# Patient Record
Sex: Male | Born: 1945 | Race: White | Hispanic: No | Marital: Single | Smoking: Never smoker
Health system: Southern US, Community
[De-identification: ages and names within clinical notes are randomized; demographics above are authoritative.]

## PROBLEM LIST (undated history)

## (undated) DIAGNOSIS — E079 Disorder of thyroid, unspecified: Secondary | ICD-10-CM

## (undated) DIAGNOSIS — F0390 Unspecified dementia without behavioral disturbance: Secondary | ICD-10-CM

---

## 2007-12-14 ENCOUNTER — Ambulatory Visit: Payer: Self-pay | Admitting: Orthopedic Surgery

## 2008-01-01 ENCOUNTER — Ambulatory Visit: Payer: Self-pay | Admitting: Orthopedic Surgery

## 2011-03-29 ENCOUNTER — Ambulatory Visit: Payer: Self-pay | Admitting: Internal Medicine

## 2012-03-26 DIAGNOSIS — M8448XA Pathological fracture, other site, initial encounter for fracture: Secondary | ICD-10-CM | POA: Diagnosis not present

## 2012-03-26 DIAGNOSIS — Z125 Encounter for screening for malignant neoplasm of prostate: Secondary | ICD-10-CM | POA: Diagnosis not present

## 2012-03-26 DIAGNOSIS — I359 Nonrheumatic aortic valve disorder, unspecified: Secondary | ICD-10-CM | POA: Diagnosis not present

## 2012-03-26 DIAGNOSIS — Z79899 Other long term (current) drug therapy: Secondary | ICD-10-CM | POA: Diagnosis not present

## 2012-05-16 DIAGNOSIS — H905 Unspecified sensorineural hearing loss: Secondary | ICD-10-CM | POA: Diagnosis not present

## 2012-05-16 DIAGNOSIS — H9319 Tinnitus, unspecified ear: Secondary | ICD-10-CM | POA: Diagnosis not present

## 2012-05-16 DIAGNOSIS — J018 Other acute sinusitis: Secondary | ICD-10-CM | POA: Diagnosis not present

## 2012-05-16 DIAGNOSIS — R220 Localized swelling, mass and lump, head: Secondary | ICD-10-CM | POA: Diagnosis not present

## 2012-05-16 DIAGNOSIS — R221 Localized swelling, mass and lump, neck: Secondary | ICD-10-CM | POA: Diagnosis not present

## 2012-05-16 DIAGNOSIS — H903 Sensorineural hearing loss, bilateral: Secondary | ICD-10-CM | POA: Diagnosis not present

## 2012-05-18 ENCOUNTER — Ambulatory Visit: Payer: Self-pay | Admitting: Otolaryngology

## 2012-05-18 DIAGNOSIS — M5030 Other cervical disc degeneration, unspecified cervical region: Secondary | ICD-10-CM | POA: Diagnosis not present

## 2012-05-18 DIAGNOSIS — R220 Localized swelling, mass and lump, head: Secondary | ICD-10-CM | POA: Diagnosis not present

## 2012-05-22 DIAGNOSIS — R221 Localized swelling, mass and lump, neck: Secondary | ICD-10-CM | POA: Diagnosis not present

## 2012-05-22 DIAGNOSIS — J018 Other acute sinusitis: Secondary | ICD-10-CM | POA: Diagnosis not present

## 2012-05-22 DIAGNOSIS — H903 Sensorineural hearing loss, bilateral: Secondary | ICD-10-CM | POA: Diagnosis not present

## 2012-05-22 DIAGNOSIS — H9319 Tinnitus, unspecified ear: Secondary | ICD-10-CM | POA: Diagnosis not present

## 2012-10-22 DIAGNOSIS — S7000XA Contusion of unspecified hip, initial encounter: Secondary | ICD-10-CM | POA: Diagnosis not present

## 2012-11-22 DIAGNOSIS — H9319 Tinnitus, unspecified ear: Secondary | ICD-10-CM | POA: Diagnosis not present

## 2012-11-22 DIAGNOSIS — J018 Other acute sinusitis: Secondary | ICD-10-CM | POA: Diagnosis not present

## 2012-12-18 DIAGNOSIS — M25559 Pain in unspecified hip: Secondary | ICD-10-CM | POA: Diagnosis not present

## 2013-01-15 DIAGNOSIS — C61 Malignant neoplasm of prostate: Secondary | ICD-10-CM | POA: Diagnosis not present

## 2013-01-15 DIAGNOSIS — D400 Neoplasm of uncertain behavior of prostate: Secondary | ICD-10-CM | POA: Diagnosis not present

## 2013-01-15 DIAGNOSIS — N529 Male erectile dysfunction, unspecified: Secondary | ICD-10-CM | POA: Diagnosis not present

## 2013-02-04 DIAGNOSIS — M25559 Pain in unspecified hip: Secondary | ICD-10-CM | POA: Diagnosis not present

## 2013-03-21 DIAGNOSIS — I359 Nonrheumatic aortic valve disorder, unspecified: Secondary | ICD-10-CM | POA: Diagnosis not present

## 2013-03-21 DIAGNOSIS — Z0000 Encounter for general adult medical examination without abnormal findings: Secondary | ICD-10-CM | POA: Diagnosis not present

## 2013-03-28 DIAGNOSIS — Z0000 Encounter for general adult medical examination without abnormal findings: Secondary | ICD-10-CM | POA: Diagnosis not present

## 2013-03-28 DIAGNOSIS — I359 Nonrheumatic aortic valve disorder, unspecified: Secondary | ICD-10-CM | POA: Diagnosis not present

## 2013-04-30 DIAGNOSIS — L570 Actinic keratosis: Secondary | ICD-10-CM | POA: Diagnosis not present

## 2013-04-30 DIAGNOSIS — L821 Other seborrheic keratosis: Secondary | ICD-10-CM | POA: Diagnosis not present

## 2013-05-09 DIAGNOSIS — Z86010 Personal history of colonic polyps: Secondary | ICD-10-CM | POA: Diagnosis not present

## 2013-07-16 DIAGNOSIS — N529 Male erectile dysfunction, unspecified: Secondary | ICD-10-CM | POA: Diagnosis not present

## 2013-07-16 DIAGNOSIS — C61 Malignant neoplasm of prostate: Secondary | ICD-10-CM | POA: Diagnosis not present

## 2013-07-16 DIAGNOSIS — D400 Neoplasm of uncertain behavior of prostate: Secondary | ICD-10-CM | POA: Diagnosis not present

## 2013-07-31 DIAGNOSIS — Z4789 Encounter for other orthopedic aftercare: Secondary | ICD-10-CM | POA: Diagnosis not present

## 2013-08-03 DIAGNOSIS — S42009A Fracture of unspecified part of unspecified clavicle, initial encounter for closed fracture: Secondary | ICD-10-CM | POA: Diagnosis not present

## 2013-08-05 DIAGNOSIS — M25519 Pain in unspecified shoulder: Secondary | ICD-10-CM | POA: Diagnosis not present

## 2013-08-05 DIAGNOSIS — R079 Chest pain, unspecified: Secondary | ICD-10-CM | POA: Diagnosis not present

## 2013-08-09 DIAGNOSIS — E039 Hypothyroidism, unspecified: Secondary | ICD-10-CM | POA: Diagnosis not present

## 2013-08-26 DIAGNOSIS — M25519 Pain in unspecified shoulder: Secondary | ICD-10-CM | POA: Diagnosis not present

## 2013-08-28 DIAGNOSIS — L570 Actinic keratosis: Secondary | ICD-10-CM | POA: Diagnosis not present

## 2013-08-28 DIAGNOSIS — L821 Other seborrheic keratosis: Secondary | ICD-10-CM | POA: Diagnosis not present

## 2013-09-25 DIAGNOSIS — S42023A Displaced fracture of shaft of unspecified clavicle, initial encounter for closed fracture: Secondary | ICD-10-CM | POA: Diagnosis not present

## 2014-01-27 DIAGNOSIS — Z4789 Encounter for other orthopedic aftercare: Secondary | ICD-10-CM | POA: Diagnosis not present

## 2014-01-27 DIAGNOSIS — M25552 Pain in left hip: Secondary | ICD-10-CM | POA: Diagnosis not present

## 2014-03-11 DIAGNOSIS — H2513 Age-related nuclear cataract, bilateral: Secondary | ICD-10-CM | POA: Diagnosis not present

## 2014-04-02 DIAGNOSIS — E039 Hypothyroidism, unspecified: Secondary | ICD-10-CM | POA: Diagnosis not present

## 2014-04-09 DIAGNOSIS — E039 Hypothyroidism, unspecified: Secondary | ICD-10-CM | POA: Diagnosis not present

## 2014-04-09 DIAGNOSIS — Z0000 Encounter for general adult medical examination without abnormal findings: Secondary | ICD-10-CM | POA: Diagnosis not present

## 2014-05-16 ENCOUNTER — Ambulatory Visit: Payer: Self-pay | Admitting: Gastroenterology

## 2014-05-16 DIAGNOSIS — Z86010 Personal history of colonic polyps: Secondary | ICD-10-CM | POA: Diagnosis not present

## 2014-07-15 DIAGNOSIS — D400 Neoplasm of uncertain behavior of prostate: Secondary | ICD-10-CM | POA: Diagnosis not present

## 2014-07-15 DIAGNOSIS — N5201 Erectile dysfunction due to arterial insufficiency: Secondary | ICD-10-CM | POA: Diagnosis not present

## 2014-07-28 DIAGNOSIS — M720 Palmar fascial fibromatosis [Dupuytren]: Secondary | ICD-10-CM | POA: Diagnosis not present

## 2014-07-28 DIAGNOSIS — L821 Other seborrheic keratosis: Secondary | ICD-10-CM | POA: Diagnosis not present

## 2015-02-02 DIAGNOSIS — M545 Low back pain: Secondary | ICD-10-CM | POA: Diagnosis not present

## 2015-02-10 DIAGNOSIS — M545 Low back pain: Secondary | ICD-10-CM | POA: Diagnosis not present

## 2015-02-12 DIAGNOSIS — M545 Low back pain: Secondary | ICD-10-CM | POA: Diagnosis not present

## 2015-02-17 DIAGNOSIS — M545 Low back pain: Secondary | ICD-10-CM | POA: Diagnosis not present

## 2015-02-19 DIAGNOSIS — M545 Low back pain: Secondary | ICD-10-CM | POA: Diagnosis not present

## 2015-02-24 DIAGNOSIS — M545 Low back pain: Secondary | ICD-10-CM | POA: Diagnosis not present

## 2015-02-26 DIAGNOSIS — M545 Low back pain: Secondary | ICD-10-CM | POA: Diagnosis not present

## 2015-03-10 DIAGNOSIS — M545 Low back pain: Secondary | ICD-10-CM | POA: Diagnosis not present

## 2015-03-12 DIAGNOSIS — M545 Low back pain: Secondary | ICD-10-CM | POA: Diagnosis not present

## 2015-03-18 DIAGNOSIS — M545 Low back pain: Secondary | ICD-10-CM | POA: Diagnosis not present

## 2015-04-06 DIAGNOSIS — Z125 Encounter for screening for malignant neoplasm of prostate: Secondary | ICD-10-CM | POA: Diagnosis not present

## 2015-04-06 DIAGNOSIS — E039 Hypothyroidism, unspecified: Secondary | ICD-10-CM | POA: Diagnosis not present

## 2015-04-06 DIAGNOSIS — Z0000 Encounter for general adult medical examination without abnormal findings: Secondary | ICD-10-CM | POA: Diagnosis not present

## 2015-04-13 DIAGNOSIS — E039 Hypothyroidism, unspecified: Secondary | ICD-10-CM | POA: Diagnosis not present

## 2015-04-13 DIAGNOSIS — Z79899 Other long term (current) drug therapy: Secondary | ICD-10-CM | POA: Diagnosis not present

## 2015-04-13 DIAGNOSIS — Q231 Congenital insufficiency of aortic valve: Secondary | ICD-10-CM | POA: Diagnosis not present

## 2015-04-13 DIAGNOSIS — I350 Nonrheumatic aortic (valve) stenosis: Secondary | ICD-10-CM | POA: Diagnosis not present

## 2015-04-13 DIAGNOSIS — Z125 Encounter for screening for malignant neoplasm of prostate: Secondary | ICD-10-CM | POA: Diagnosis not present

## 2015-04-21 DIAGNOSIS — I350 Nonrheumatic aortic (valve) stenosis: Secondary | ICD-10-CM | POA: Diagnosis not present

## 2015-04-21 DIAGNOSIS — Q231 Congenital insufficiency of aortic valve: Secondary | ICD-10-CM | POA: Diagnosis not present

## 2015-06-22 DIAGNOSIS — H353131 Nonexudative age-related macular degeneration, bilateral, early dry stage: Secondary | ICD-10-CM | POA: Diagnosis not present

## 2016-04-04 DIAGNOSIS — L821 Other seborrheic keratosis: Secondary | ICD-10-CM | POA: Diagnosis not present

## 2016-04-04 DIAGNOSIS — C4490 Unspecified malignant neoplasm of skin, unspecified: Secondary | ICD-10-CM | POA: Diagnosis not present

## 2016-04-04 DIAGNOSIS — Z859 Personal history of malignant neoplasm, unspecified: Secondary | ICD-10-CM | POA: Diagnosis not present

## 2016-04-07 DIAGNOSIS — E039 Hypothyroidism, unspecified: Secondary | ICD-10-CM | POA: Diagnosis not present

## 2016-04-07 DIAGNOSIS — Z79899 Other long term (current) drug therapy: Secondary | ICD-10-CM | POA: Diagnosis not present

## 2016-04-07 DIAGNOSIS — Z125 Encounter for screening for malignant neoplasm of prostate: Secondary | ICD-10-CM | POA: Diagnosis not present

## 2016-04-13 DIAGNOSIS — Z0000 Encounter for general adult medical examination without abnormal findings: Secondary | ICD-10-CM | POA: Diagnosis not present

## 2016-04-13 DIAGNOSIS — Q230 Congenital stenosis of aortic valve: Secondary | ICD-10-CM | POA: Diagnosis not present

## 2016-04-13 DIAGNOSIS — E039 Hypothyroidism, unspecified: Secondary | ICD-10-CM | POA: Diagnosis not present

## 2016-04-13 DIAGNOSIS — Q231 Congenital insufficiency of aortic valve: Secondary | ICD-10-CM | POA: Diagnosis not present

## 2016-04-27 DIAGNOSIS — Q230 Congenital stenosis of aortic valve: Secondary | ICD-10-CM | POA: Diagnosis not present

## 2016-04-27 DIAGNOSIS — Q231 Congenital insufficiency of aortic valve: Secondary | ICD-10-CM | POA: Diagnosis not present

## 2016-04-28 DIAGNOSIS — M79641 Pain in right hand: Secondary | ICD-10-CM | POA: Diagnosis not present

## 2016-07-13 DIAGNOSIS — Z23 Encounter for immunization: Secondary | ICD-10-CM | POA: Diagnosis not present

## 2016-07-13 DIAGNOSIS — E039 Hypothyroidism, unspecified: Secondary | ICD-10-CM | POA: Diagnosis not present

## 2016-07-13 DIAGNOSIS — S41112A Laceration without foreign body of left upper arm, initial encounter: Secondary | ICD-10-CM | POA: Diagnosis not present

## 2016-10-20 DIAGNOSIS — N5201 Erectile dysfunction due to arterial insufficiency: Secondary | ICD-10-CM | POA: Diagnosis not present

## 2016-10-20 DIAGNOSIS — C61 Malignant neoplasm of prostate: Secondary | ICD-10-CM | POA: Diagnosis not present

## 2016-10-20 DIAGNOSIS — D400 Neoplasm of uncertain behavior of prostate: Secondary | ICD-10-CM | POA: Diagnosis not present

## 2017-01-04 DIAGNOSIS — H5213 Myopia, bilateral: Secondary | ICD-10-CM | POA: Diagnosis not present

## 2017-01-04 DIAGNOSIS — H2513 Age-related nuclear cataract, bilateral: Secondary | ICD-10-CM | POA: Diagnosis not present

## 2017-01-05 DIAGNOSIS — M65342 Trigger finger, left ring finger: Secondary | ICD-10-CM | POA: Diagnosis not present

## 2017-01-05 DIAGNOSIS — M65341 Trigger finger, right ring finger: Secondary | ICD-10-CM | POA: Diagnosis not present

## 2017-02-16 DIAGNOSIS — L821 Other seborrheic keratosis: Secondary | ICD-10-CM | POA: Diagnosis not present

## 2017-03-24 DIAGNOSIS — R05 Cough: Secondary | ICD-10-CM | POA: Diagnosis not present

## 2017-03-24 DIAGNOSIS — J209 Acute bronchitis, unspecified: Secondary | ICD-10-CM | POA: Diagnosis not present

## 2017-04-12 DIAGNOSIS — Z0000 Encounter for general adult medical examination without abnormal findings: Secondary | ICD-10-CM | POA: Diagnosis not present

## 2017-04-12 DIAGNOSIS — Z125 Encounter for screening for malignant neoplasm of prostate: Secondary | ICD-10-CM | POA: Diagnosis not present

## 2017-04-12 DIAGNOSIS — E039 Hypothyroidism, unspecified: Secondary | ICD-10-CM | POA: Diagnosis not present

## 2017-04-19 DIAGNOSIS — M6530 Trigger finger, unspecified finger: Secondary | ICD-10-CM | POA: Diagnosis not present

## 2017-04-19 DIAGNOSIS — Z0000 Encounter for general adult medical examination without abnormal findings: Secondary | ICD-10-CM | POA: Diagnosis not present

## 2017-04-19 DIAGNOSIS — E039 Hypothyroidism, unspecified: Secondary | ICD-10-CM | POA: Diagnosis not present

## 2017-04-19 DIAGNOSIS — Z125 Encounter for screening for malignant neoplasm of prostate: Secondary | ICD-10-CM | POA: Diagnosis not present

## 2017-04-19 DIAGNOSIS — Z79899 Other long term (current) drug therapy: Secondary | ICD-10-CM | POA: Diagnosis not present

## 2017-05-01 DIAGNOSIS — M65342 Trigger finger, left ring finger: Secondary | ICD-10-CM | POA: Diagnosis not present

## 2017-05-01 DIAGNOSIS — M65341 Trigger finger, right ring finger: Secondary | ICD-10-CM | POA: Diagnosis not present

## 2017-05-12 ENCOUNTER — Other Ambulatory Visit: Payer: Self-pay | Admitting: Internal Medicine

## 2017-05-12 DIAGNOSIS — G9349 Other encephalopathy: Secondary | ICD-10-CM

## 2017-05-17 ENCOUNTER — Ambulatory Visit
Admission: RE | Admit: 2017-05-17 | Discharge: 2017-05-17 | Disposition: A | Discharge status: Home / Self Care | Payer: PPO | Source: Ambulatory Visit | Attending: Internal Medicine | Admitting: Internal Medicine

## 2017-05-17 DIAGNOSIS — I672 Cerebral atherosclerosis: Secondary | ICD-10-CM | POA: Diagnosis not present

## 2017-05-17 DIAGNOSIS — G319 Degenerative disease of nervous system, unspecified: Secondary | ICD-10-CM | POA: Diagnosis not present

## 2017-05-17 DIAGNOSIS — G9349 Other encephalopathy: Secondary | ICD-10-CM | POA: Diagnosis not present

## 2017-05-17 DIAGNOSIS — I739 Peripheral vascular disease, unspecified: Secondary | ICD-10-CM | POA: Insufficient documentation

## 2017-05-17 DIAGNOSIS — R413 Other amnesia: Secondary | ICD-10-CM | POA: Diagnosis not present

## 2017-05-29 DIAGNOSIS — I679 Cerebrovascular disease, unspecified: Secondary | ICD-10-CM | POA: Diagnosis not present

## 2017-05-29 DIAGNOSIS — Q231 Congenital insufficiency of aortic valve: Secondary | ICD-10-CM | POA: Diagnosis not present

## 2017-05-29 DIAGNOSIS — Q230 Congenital stenosis of aortic valve: Secondary | ICD-10-CM | POA: Diagnosis not present

## 2017-05-31 DIAGNOSIS — G3184 Mild cognitive impairment, so stated: Secondary | ICD-10-CM | POA: Diagnosis not present

## 2017-05-31 DIAGNOSIS — R413 Other amnesia: Secondary | ICD-10-CM | POA: Diagnosis not present

## 2017-06-08 DIAGNOSIS — I679 Cerebrovascular disease, unspecified: Secondary | ICD-10-CM | POA: Diagnosis not present

## 2017-06-08 DIAGNOSIS — I6523 Occlusion and stenosis of bilateral carotid arteries: Secondary | ICD-10-CM | POA: Diagnosis not present

## 2017-06-08 DIAGNOSIS — Q230 Congenital stenosis of aortic valve: Secondary | ICD-10-CM | POA: Diagnosis not present

## 2017-06-08 DIAGNOSIS — Q231 Congenital insufficiency of aortic valve: Secondary | ICD-10-CM | POA: Diagnosis not present

## 2017-06-23 DIAGNOSIS — G3184 Mild cognitive impairment, so stated: Secondary | ICD-10-CM | POA: Diagnosis not present

## 2017-07-11 DIAGNOSIS — L821 Other seborrheic keratosis: Secondary | ICD-10-CM | POA: Diagnosis not present

## 2017-07-11 DIAGNOSIS — L570 Actinic keratosis: Secondary | ICD-10-CM | POA: Diagnosis not present

## 2017-08-03 DIAGNOSIS — G3184 Mild cognitive impairment, so stated: Secondary | ICD-10-CM | POA: Diagnosis not present

## 2017-09-26 DIAGNOSIS — L570 Actinic keratosis: Secondary | ICD-10-CM | POA: Diagnosis not present

## 2017-10-25 DIAGNOSIS — C61 Malignant neoplasm of prostate: Secondary | ICD-10-CM | POA: Diagnosis not present

## 2017-10-25 DIAGNOSIS — Z125 Encounter for screening for malignant neoplasm of prostate: Secondary | ICD-10-CM | POA: Diagnosis not present

## 2017-10-25 DIAGNOSIS — D400 Neoplasm of uncertain behavior of prostate: Secondary | ICD-10-CM | POA: Diagnosis not present

## 2017-10-25 DIAGNOSIS — N5201 Erectile dysfunction due to arterial insufficiency: Secondary | ICD-10-CM | POA: Diagnosis not present

## 2017-10-31 DIAGNOSIS — M25552 Pain in left hip: Secondary | ICD-10-CM | POA: Diagnosis not present

## 2017-12-29 DIAGNOSIS — H6502 Acute serous otitis media, left ear: Secondary | ICD-10-CM | POA: Diagnosis not present

## 2017-12-29 DIAGNOSIS — J3489 Other specified disorders of nose and nasal sinuses: Secondary | ICD-10-CM | POA: Diagnosis not present

## 2017-12-29 DIAGNOSIS — Z23 Encounter for immunization: Secondary | ICD-10-CM | POA: Diagnosis not present

## 2018-01-24 DIAGNOSIS — R05 Cough: Secondary | ICD-10-CM | POA: Diagnosis not present

## 2018-01-24 DIAGNOSIS — J019 Acute sinusitis, unspecified: Secondary | ICD-10-CM | POA: Diagnosis not present

## 2018-02-05 DIAGNOSIS — R5382 Chronic fatigue, unspecified: Secondary | ICD-10-CM | POA: Diagnosis not present

## 2018-02-05 DIAGNOSIS — G3184 Mild cognitive impairment, so stated: Secondary | ICD-10-CM | POA: Diagnosis not present

## 2018-02-23 DIAGNOSIS — R05 Cough: Secondary | ICD-10-CM | POA: Diagnosis not present

## 2018-02-23 DIAGNOSIS — J019 Acute sinusitis, unspecified: Secondary | ICD-10-CM | POA: Diagnosis not present

## 2018-03-08 DIAGNOSIS — R05 Cough: Secondary | ICD-10-CM | POA: Diagnosis not present

## 2018-03-08 DIAGNOSIS — R0982 Postnasal drip: Secondary | ICD-10-CM | POA: Diagnosis not present

## 2018-03-08 DIAGNOSIS — R0602 Shortness of breath: Secondary | ICD-10-CM | POA: Diagnosis not present

## 2018-03-08 DIAGNOSIS — J019 Acute sinusitis, unspecified: Secondary | ICD-10-CM | POA: Diagnosis not present

## 2018-03-22 DIAGNOSIS — H2513 Age-related nuclear cataract, bilateral: Secondary | ICD-10-CM | POA: Diagnosis not present

## 2018-03-22 DIAGNOSIS — H35363 Drusen (degenerative) of macula, bilateral: Secondary | ICD-10-CM | POA: Diagnosis not present

## 2018-03-22 DIAGNOSIS — H43813 Vitreous degeneration, bilateral: Secondary | ICD-10-CM | POA: Diagnosis not present

## 2018-04-11 DIAGNOSIS — L570 Actinic keratosis: Secondary | ICD-10-CM | POA: Diagnosis not present

## 2018-04-11 DIAGNOSIS — L821 Other seborrheic keratosis: Secondary | ICD-10-CM | POA: Diagnosis not present

## 2018-04-18 DIAGNOSIS — Z125 Encounter for screening for malignant neoplasm of prostate: Secondary | ICD-10-CM | POA: Diagnosis not present

## 2018-04-18 DIAGNOSIS — E039 Hypothyroidism, unspecified: Secondary | ICD-10-CM | POA: Diagnosis not present

## 2018-04-18 DIAGNOSIS — Z79899 Other long term (current) drug therapy: Secondary | ICD-10-CM | POA: Diagnosis not present

## 2018-04-25 DIAGNOSIS — Z125 Encounter for screening for malignant neoplasm of prostate: Secondary | ICD-10-CM | POA: Diagnosis not present

## 2018-04-25 DIAGNOSIS — C61 Malignant neoplasm of prostate: Secondary | ICD-10-CM | POA: Diagnosis not present

## 2018-04-25 DIAGNOSIS — Q231 Congenital insufficiency of aortic valve: Secondary | ICD-10-CM | POA: Diagnosis not present

## 2018-04-25 DIAGNOSIS — Z0000 Encounter for general adult medical examination without abnormal findings: Secondary | ICD-10-CM | POA: Diagnosis not present

## 2018-04-25 DIAGNOSIS — E039 Hypothyroidism, unspecified: Secondary | ICD-10-CM | POA: Diagnosis not present

## 2018-04-25 DIAGNOSIS — Q230 Congenital stenosis of aortic valve: Secondary | ICD-10-CM | POA: Diagnosis not present

## 2018-11-11 ENCOUNTER — Inpatient Hospital Stay: Payer: PPO

## 2018-11-11 ENCOUNTER — Emergency Department: Payer: PPO

## 2018-11-11 ENCOUNTER — Inpatient Hospital Stay
Admission: EM | Admit: 2018-11-11 | Discharge: 2018-11-14 | DRG: 065 | Disposition: A | Discharge status: Home Health | Payer: PPO | Attending: Internal Medicine | Admitting: Internal Medicine

## 2018-11-11 ENCOUNTER — Other Ambulatory Visit: Payer: Self-pay

## 2018-11-11 DIAGNOSIS — E785 Hyperlipidemia, unspecified: Secondary | ICD-10-CM | POA: Diagnosis present

## 2018-11-11 DIAGNOSIS — E039 Hypothyroidism, unspecified: Secondary | ICD-10-CM | POA: Diagnosis present

## 2018-11-11 DIAGNOSIS — H532 Diplopia: Secondary | ICD-10-CM | POA: Diagnosis not present

## 2018-11-11 DIAGNOSIS — R29810 Facial weakness: Secondary | ICD-10-CM | POA: Diagnosis present

## 2018-11-11 DIAGNOSIS — R29705 NIHSS score 5: Secondary | ICD-10-CM | POA: Diagnosis present

## 2018-11-11 DIAGNOSIS — K219 Gastro-esophageal reflux disease without esophagitis: Secondary | ICD-10-CM | POA: Diagnosis present

## 2018-11-11 DIAGNOSIS — R29702 NIHSS score 2: Secondary | ICD-10-CM | POA: Diagnosis present

## 2018-11-11 DIAGNOSIS — I712 Thoracic aortic aneurysm, without rupture: Secondary | ICD-10-CM | POA: Diagnosis not present

## 2018-11-11 DIAGNOSIS — I350 Nonrheumatic aortic (valve) stenosis: Secondary | ICD-10-CM | POA: Diagnosis not present

## 2018-11-11 DIAGNOSIS — Z66 Do not resuscitate: Secondary | ICD-10-CM | POA: Diagnosis not present

## 2018-11-11 DIAGNOSIS — R4781 Slurred speech: Secondary | ICD-10-CM | POA: Diagnosis not present

## 2018-11-11 DIAGNOSIS — I639 Cerebral infarction, unspecified: Secondary | ICD-10-CM | POA: Diagnosis present

## 2018-11-11 DIAGNOSIS — I6359 Cerebral infarction due to unspecified occlusion or stenosis of other cerebral artery: Secondary | ICD-10-CM | POA: Diagnosis not present

## 2018-11-11 DIAGNOSIS — R29818 Other symptoms and signs involving the nervous system: Secondary | ICD-10-CM | POA: Diagnosis not present

## 2018-11-11 DIAGNOSIS — Z79899 Other long term (current) drug therapy: Secondary | ICD-10-CM | POA: Diagnosis not present

## 2018-11-11 DIAGNOSIS — I6340 Cerebral infarction due to embolism of unspecified cerebral artery: Secondary | ICD-10-CM | POA: Diagnosis not present

## 2018-11-11 DIAGNOSIS — F0150 Vascular dementia without behavioral disturbance: Secondary | ICD-10-CM | POA: Diagnosis not present

## 2018-11-11 DIAGNOSIS — Z20828 Contact with and (suspected) exposure to other viral communicable diseases: Secondary | ICD-10-CM | POA: Diagnosis not present

## 2018-11-11 DIAGNOSIS — Q231 Congenital insufficiency of aortic valve: Secondary | ICD-10-CM | POA: Diagnosis not present

## 2018-11-11 DIAGNOSIS — I69319 Unspecified symptoms and signs involving cognitive functions following cerebral infarction: Secondary | ICD-10-CM | POA: Diagnosis not present

## 2018-11-11 DIAGNOSIS — Z79890 Hormone replacement therapy (postmenopausal): Secondary | ICD-10-CM

## 2018-11-11 DIAGNOSIS — R471 Dysarthria and anarthria: Secondary | ICD-10-CM | POA: Diagnosis not present

## 2018-11-11 DIAGNOSIS — Z7982 Long term (current) use of aspirin: Secondary | ICD-10-CM | POA: Diagnosis not present

## 2018-11-11 DIAGNOSIS — Z03818 Encounter for observation for suspected exposure to other biological agents ruled out: Secondary | ICD-10-CM | POA: Diagnosis not present

## 2018-11-11 HISTORY — DX: Disorder of thyroid, unspecified: E07.9

## 2018-11-11 HISTORY — DX: Unspecified dementia, unspecified severity, without behavioral disturbance, psychotic disturbance, mood disturbance, and anxiety: F03.90

## 2018-11-11 LAB — COMPREHENSIVE METABOLIC PANEL
ALT: 20 U/L (ref 0–44)
AST: 30 U/L (ref 15–41)
Albumin: 3.9 g/dL (ref 3.5–5.0)
Alkaline Phosphatase: 67 U/L (ref 38–126)
Anion gap: 8 (ref 5–15)
BUN: 28 mg/dL — ABNORMAL HIGH (ref 8–23)
CO2: 23 mmol/L (ref 22–32)
Calcium: 8.9 mg/dL (ref 8.9–10.3)
Chloride: 108 mmol/L (ref 98–111)
Creatinine, Ser: 1.01 mg/dL (ref 0.61–1.24)
GFR calc Af Amer: >60 mL/min (ref >60)
GFR calc non Af Amer: >60 mL/min (ref >60)
Glucose, Bld: 96 mg/dL (ref 70–99)
Potassium: 4 mmol/L (ref 3.5–5.1)
Sodium: 139 mmol/L (ref 135–145)
Total Bilirubin: 0.7 mg/dL (ref 0.3–1.2)
Total Protein: 6.4 g/dL — ABNORMAL LOW (ref 6.5–8.1)

## 2018-11-11 LAB — HEMOGLOBIN A1C
Hgb A1c MFr Bld: 5.5 % (ref 4.8–5.6)
Mean Plasma Glucose: 111.15 mg/dL

## 2018-11-11 LAB — APTT: aPTT: 32 seconds (ref 24–36)

## 2018-11-11 LAB — PROTIME-INR
INR: 0.9 (ref 0.8–1.2)
Prothrombin Time: 12.5 seconds (ref 11.4–15.2)

## 2018-11-11 LAB — GLUCOSE, CAPILLARY: Glucose-Capillary: 90 mg/dL (ref 70–99)

## 2018-11-11 LAB — LIPID PANEL
Cholesterol: 149 mg/dL (ref 0–200)
HDL: 50 mg/dL (ref >40)
LDL Cholesterol: 89 mg/dL (ref 0–99)
Total CHOL/HDL Ratio: 3 RATIO
Triglycerides: 51 mg/dL (ref <150)
VLDL: 10 mg/dL (ref 0–40)

## 2018-11-11 LAB — CBC
HCT: 40.2 % (ref 39.0–52.0)
Hemoglobin: 13.5 g/dL (ref 13.0–17.0)
MCH: 32 pg (ref 26.0–34.0)
MCHC: 33.6 g/dL (ref 30.0–36.0)
MCV: 95.3 fL (ref 80.0–100.0)
Platelets: 154 10*3/uL (ref 150–400)
RBC: 4.22 MIL/uL (ref 4.22–5.81)
RDW: 12.2 % (ref 11.5–15.5)
WBC: 5.6 10*3/uL (ref 4.0–10.5)
nRBC: 0 % (ref 0.0–0.2)

## 2018-11-11 LAB — DIFFERENTIAL
Abs Immature Granulocytes: 0.01 10*3/uL (ref 0.00–0.07)
Basophils Absolute: 0.1 10*3/uL (ref 0.0–0.1)
Basophils Relative: 2 %
Eosinophils Absolute: 0.2 10*3/uL (ref 0.0–0.5)
Eosinophils Relative: 3 %
Immature Granulocytes: 0 %
Lymphocytes Relative: 28 %
Lymphs Abs: 1.6 10*3/uL (ref 0.7–4.0)
Monocytes Absolute: 0.6 10*3/uL (ref 0.1–1.0)
Monocytes Relative: 10 %
Neutro Abs: 3.2 10*3/uL (ref 1.7–7.7)
Neutrophils Relative %: 57 %

## 2018-11-11 LAB — SARS CORONAVIRUS 2 BY RT PCR (HOSPITAL ORDER, PERFORMED IN ~~LOC~~ HOSPITAL LAB): SARS Coronavirus 2: NEGATIVE

## 2018-11-11 MED ORDER — ASPIRIN EC 81 MG PO TBEC
81.0000 mg | DELAYED_RELEASE_TABLET | Freq: Every day | ORAL | Status: DC
  Administered 2018-11-12 – 2018-11-14 (×3): 81 mg via ORAL
  Filled 2018-11-11 (×3): qty 1

## 2018-11-11 MED ORDER — LEVOTHYROXINE SODIUM 100 MCG PO TABS
100.0000 ug | ORAL_TABLET | Freq: Every day | ORAL | Status: DC
  Administered 2018-11-13: 05:00:00 100 ug via ORAL
  Filled 2018-11-11: qty 2

## 2018-11-11 MED ORDER — ACETAMINOPHEN 325 MG PO TABS
650.0000 mg | ORAL_TABLET | ORAL | Status: DC | PRN
  Administered 2018-11-12 – 2018-11-13 (×3): 650 mg via ORAL
  Filled 2018-11-11 (×3): qty 2

## 2018-11-11 MED ORDER — ETODOLAC 400 MG PO TABS
400.0000 mg | ORAL_TABLET | Freq: Two times a day (BID) | ORAL | Status: DC
  Administered 2018-11-12 – 2018-11-14 (×5): 400 mg via ORAL
  Filled 2018-11-11 (×7): qty 1

## 2018-11-11 MED ORDER — STROKE: EARLY STAGES OF RECOVERY BOOK: Freq: Once | Status: DC

## 2018-11-11 MED ORDER — SENNOSIDES-DOCUSATE SODIUM 8.6-50 MG PO TABS: 1.0000 | ORAL_TABLET | Freq: Every evening | ORAL | Status: DC | PRN

## 2018-11-11 MED ORDER — ACETAMINOPHEN 650 MG RE SUPP: 650.0000 mg | RECTAL | Status: DC | PRN

## 2018-11-11 MED ORDER — SODIUM CHLORIDE 0.9% FLUSH
3.0000 mL | Freq: Once | INTRAVENOUS | Status: AC
Start: 2018-11-11 — End: 2018-11-11
  Administered 2018-11-11: 22:00:00 3 mL via INTRAVENOUS

## 2018-11-11 MED ORDER — ADULT MULTIVITAMIN W/MINERALS CH
1.0000 | ORAL_TABLET | Freq: Every day | ORAL | Status: DC
  Administered 2018-11-12 – 2018-11-14 (×3): 1 via ORAL
  Filled 2018-11-11 (×4): qty 1

## 2018-11-11 MED ORDER — ACETAMINOPHEN 160 MG/5ML PO SOLN
650.0000 mg | ORAL | Status: DC | PRN
  Filled 2018-11-11: qty 20.3

## 2018-11-11 MED ORDER — GALANTAMINE HYDROBROMIDE ER 8 MG PO CP24
16.0000 mg | ORAL_CAPSULE | Freq: Every day | ORAL | Status: DC
  Administered 2018-11-12 – 2018-11-14 (×3): 16 mg via ORAL
  Filled 2018-11-11 (×3): qty 2

## 2018-11-11 MED ORDER — SODIUM CHLORIDE 0.9 % IV SOLN
INTRAVENOUS | Status: DC
  Administered 2018-11-11 – 2018-11-12 (×3): via INTRAVENOUS

## 2018-11-11 MED ORDER — ENOXAPARIN SODIUM 40 MG/0.4ML ~~LOC~~ SOLN
40.0000 mg | SUBCUTANEOUS | Status: DC
  Administered 2018-11-11 – 2018-11-13 (×3): 40 mg via SUBCUTANEOUS
  Filled 2018-11-11 (×3): qty 0.4

## 2018-11-11 MED ORDER — VITAMIN B-12 1000 MCG PO TABS
1000.0000 ug | ORAL_TABLET | Freq: Every day | ORAL | Status: DC
  Administered 2018-11-12 – 2018-11-14 (×3): 1000 ug via ORAL
  Filled 2018-11-11 (×3): qty 1

## 2018-11-11 MED ORDER — CLOPIDOGREL BISULFATE 75 MG PO TABS
75.0000 mg | ORAL_TABLET | Freq: Every day | ORAL | Status: DC
  Administered 2018-11-12 – 2018-11-14 (×3): 75 mg via ORAL
  Filled 2018-11-11 (×3): qty 1

## 2018-11-11 MED ORDER — IOHEXOL 350 MG/ML SOLN
75.0000 mL | Freq: Once | INTRAVENOUS | Status: AC | PRN
  Administered 2018-11-11: 19:00:00 75 mL via INTRAVENOUS

## 2018-11-11 NOTE — ED Notes (Signed)
Dr Archie Balboa spoke to pt and spouse about risk of TPA and pt refused TPA

## 2018-11-11 NOTE — ED Notes (Addendum)
cbg 90 Per pt spouse last known normal was at breakfast 8am - she spoke with him at noon and he left for his office - at 3pm spouse went to office to check on pt - he was noted to be confused, difficulty with ambulation, blurred vision/double vision, and erratic driving - he was noted to have left sided facial droop At 406pm button pushed for teleneuro and nurse responded  At 413pm neurologist arrived on screen

## 2018-11-11 NOTE — Progress Notes (Signed)
CODE STROKE- PHARMACY COMMUNICATION   Time CODE STROKE called/page received:16:03  Time response to CODE STROKE was made (in person or via phone):   Time Stroke Kit retrieved from [facility] (only if needed):06:08  Name of Provider/Nurse contacted:   No past medical history on file. Prior to Admission medications   Medication Sig Start Date End Date Taking? Authorizing Provider  aspirin EC 81 MG tablet Take 81 mg by mouth daily.   Yes [provider]  etodolac (LODINE) 400 MG tablet Take 400 mg by mouth 2 (two) times daily. 10/31/18  Yes [provider]  galantamine (RAZADYNE ER) 16 MG 24 hr capsule Take 16 mg by mouth daily. 10/31/18  Yes [provider]  levothyroxine (SYNTHROID) 100 MCG tablet Take 100 mcg by mouth daily. 10/24/18  Yes [provider]  Multiple Vitamin (MULTI-VITAMIN) tablet Take 1 tablet by mouth daily.   Yes [provider]  vitamin B-12 (CYANOCOBALAMIN) 1000 MCG tablet Take 1,000 mcg by mouth daily.   Yes [provider]   Began mixing tPA, upon hearing possible side effects the patient declined therapy. All tPA returned to pharmacy for credit.   G  ,PharmD Clinical Pharmacist  11/11/2018  4:52 PM   

## 2018-11-11 NOTE — ED Notes (Signed)
Pt reports that he started having sxs at 130pm

## 2018-11-11 NOTE — ED Provider Notes (Signed)
[facility] Emergency Department Provider Note   ____________________________________________   I have reviewed the triage vital signs and the nursing notes.   HISTORY  Chief Complaint Code Stroke   History limited by: Not Limited   HPI Ronald Adkins is a 73 y.o. male who presents to the emergency department today because of concern for change in vision and difficulty recognizing his wife. Patient was in his normal state of health earlier today. States that at around 1-130 today started noticing problems with his vision. Feels like he is seeing double. Vision out of either eye with his eyes closed is normal. Was not able to recognize his wife earlier today but by the time of my exam states that that has improved.   Records reviewed. Per medical record review patient has a history of hypothyroid  No past medical history on file.  There are no active problems to display for this patient.   Prior to Admission medications   Medication Sig Start Date End Date Taking? Authorizing Provider  etodolac (LODINE) 400 MG tablet Take 400 mg by mouth 2 (two) times daily. 10/31/18   [provider]  galantamine (RAZADYNE ER) 16 MG 24 hr capsule Take 16 mg by mouth daily. 10/31/18   [provider]  levothyroxine (SYNTHROID) 100 MCG tablet Take 100 mcg by mouth daily. 10/24/18   [provider]    Allergies Patient has no allergy information on record.  No family history on file.  Social History Social History   Tobacco Use  . Smoking status: Not on file  Substance Use Topics  . Alcohol use: Not on file  . Drug use: Not on file    Review of Systems Constitutional: No fever/chills Eyes: Positive for double vision. ENT: No sore throat. Cardiovascular: Denies chest pain. Respiratory: Denies shortness of breath. Gastrointestinal: No abdominal pain.  No nausea, no vomiting.  No diarrhea.   Genitourinary: Negative for  dysuria. Musculoskeletal: Negative for back pain. Skin: Negative for rash. Neurological: Double vision. Difficulty with recognizing faces.  ____________________________________________   PHYSICAL EXAM:  VITAL SIGNS: ED Triage Vitals [11/11/18 1628]  Enc Vitals Group     BP (!) 123/91     Pulse Rate 69     Resp 20     Temp      Temp src      SpO2 96 %    Constitutional: Alert and oriented.  Eyes: Conjunctivae are normal.  ENT      Head: Normocephalic and atraumatic.      Nose: No congestion/rhinnorhea.      Mouth/Throat: Mucous membranes are moist.      Neck: No stridor. Hematological/Lymphatic/Immunilogical: No cervical lymphadenopathy. Cardiovascular: Normal rate, regular rhythm.  No murmurs, rubs, or gallops.  Respiratory: Normal respiratory effort without tachypnea nor retractions. Breath sounds are clear and equal bilaterally. No wheezes/rales/rhonchi. Gastrointestinal: Soft and non tender. No rebound. No guarding.  Genitourinary: Deferred Musculoskeletal: Normal range of motion in all extremities. No lower extremity edema. Neurologic: Normal speech. Some disconjugate gaze appreciated.  PERRL. No pronator drift. Sensation intact. Lower extremity strength 5/5 Skin:  Skin is warm, dry and intact. No rash noted. Psychiatric: Mood and affect are normal. Speech and behavior are normal. Patient exhibits appropriate insight and judgment.  ____________________________________________    LABS (pertinent positives/negatives)  CMP wnl except BUN 28, t protein 6.4 CBC wbc 5.6, hgb 13.5, plt 154  ____________________________________________   EKG  I, Nance Pear, attending physician, personally viewed and interpreted  this EKG  EKG Time: 1655 Rate: 69 Rhythm: sinus rhythm Axis: left axis deviation Intervals: qtc 453 QRS: narrow ST changes: no st elevation Impression: abnormal ekg   ____________________________________________    RADIOLOGY  CT head No  acute infarct  ____________________________________________   PROCEDURES  Procedures  ____________________________________________   INITIAL IMPRESSION / ASSESSMENT AND PLAN / ED COURSE  Pertinent labs & imaging results that were available during my care of the patient were reviewed by me and considered in my medical decision making (see chart for details).   Patient presented to the emergency department today with primary concerns for double vision.  Patient initially had some difficulty recommend his wife but at the time of my exam the patient stated that that has resolved.  He denies any weakness or numbness to his body.  Patient was seen by tele-neurologist.  In discussion with the patient about risk and benefits of TPA the patient declined TPA.  Will plan on admission for stroke work-up.  ____________________________________________   FINAL CLINICAL IMPRESSION(S) / ED DIAGNOSES  Final diagnoses:  Diplopia     Note: This dictation was prepared with Dragon dictation. Any transcriptional errors that result from this process are unintentional     Nance Pear, MD 11/11/18 2141

## 2018-11-11 NOTE — ED Notes (Signed)
Pt failed swallow screen so PO medications will not be given

## 2018-11-11 NOTE — ED Notes (Signed)
Assisted pt to toilet with ED Tech. Brought blanket for wife.

## 2018-11-11 NOTE — Consult Note (Signed)
[facility] TeleNeurology Consult Services   Date of Service:   11/11/2018 16:07:51  Impression:     .  Rule Out Acute Ischemic Stroke  Comments/Sign-Out: Patients clinical features sudden onset vertical diplopia, left VFD, can be compatible with diagnosis of acute ischemic stroke however other vascular and non-vascular conditions that present with an acute neurological deficit simulating acute ischemic stroke is possible. Chief differential include: Ischemic neuropathy  Metrics: Last Known Well: 11/11/2018 13:30:51 [facility] Notification Time: 11/11/2018 16:07:51 Arrival Time: 11/11/2018 15:56:51 Stamp Time: 11/11/2018 16:07:51 Time First Login Attempt: 11/11/2018 16:09:27 Video Start Time: 11/11/2018 16:09:27  Symptoms: AMS, diplopia NIHSS Start Assessment Time: 11/11/2018 16:15:27 Alteplase/Activase Verbal Order Time: 11/11/2018 16:31:06 Patient is not a candidate for Alteplase/Activase. Patient was not deemed candidate for Alteplase/Activase thrombolytics because of Risk benefits and alternatives to tPA discussed . Pt declined tPA.. Weight Noted by Staff: 190 lbs Video End Time: 11/11/2018 16:45:24  CT head showed no acute hemorrhage or acute core infarct.  Clinical Presentation is not Suggestive of Large Vessel Occlusive Disease  Radiologist was not called back for review of advanced imaging because NA ED Physician notified of diagnostic impression and management plan on 11/11/2018 16:46:12  Alteplase/Activase Contraindications:  Our recommendations are outlined below.  Recommendations:     .  Activate Stroke Protocol Admission/Order Set     .  Stroke/Telemetry Floor     .  Neuro Checks     .  Bedside Swallow Eval     .  DVT Prophylaxis     .  IV Fluids, Normal Saline     .  Head of Bed 30 Degrees     .  Euglycemia and Avoid Hyperthermia (PRN Acetaminophen)     .  Antiplatelet Therapy Recommended  Routine Consultation with [facility] for Follow up Care  Sign Out:     .  Discussed with Emergency Department Provider    ------------------------------------------------------------------------------  History of Present Illness: Patient is a 73 year old Male.  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 In the ED pts symptom (ie not recognizing his wife had resolved). In the ED however he c/o vertical diplopia most prominent in rt gaze. Pt said that the prior to 1:30 pm he did not have this issue. Pt initially wanted to have tPA but later on hearing about the risks decided not to take tPA Medications: vitamins, Asa BP: 123/91 Blood glucose: 90  Last seen normal was within 4.5 hours. There is no history of hemorrhagic complications or intracranial hemorrhage. There is no history of Recent Anticoagulants. There is no history of recent major surgery. There is no history of recent stroke.  Past Medical History:    Examination: 1A: Level of Consciousness - Alert; keenly responsive + 0 1B: Ask Month and Age - Both Questions Right + 0 1C: Blink Eyes & Squeeze Hands - Performs Both Tasks + 0 2: Test Horizontal Extraocular Movements - Partial Gaze Palsy: Can Be Overcome + 1 3: Test Visual Fields - Partial Hemianopia + 1 4: Test Facial Palsy (Use Grimace if Obtunded) - Normal symmetry + 0 5A: Test Left Arm Motor Drift - No Drift for 10 Seconds +  0 5B: Test Right Arm Motor Drift - No Drift for 10 Seconds + 0 6A: Test Left Leg Motor Drift - No Drift for 5 Seconds + 0 6B: Test Right Leg Motor Drift - No Drift for 5 Seconds + 0 7: Test Limb Ataxia (FNF/Heel-Shin) - No Ataxia +  0 8: Test Sensation - Normal; No sensory loss + 0 9: Test Language/Aphasia - Normal; No aphasia + 0 10: Test Dysarthria - Normal + 0 11: Test Extinction/Inattention - No abnormality + 0  NIHSS Score: 2  Patient/Family was informed the Neurology Consult would happen via TeleHealth consult by way of interactive audio and video telecommunications and consented to receiving care in this manner.   Due to the immediate potential for life-threatening deterioration due to underlying acute neurologic illness, I spent 35 minutes providing critical care. This time includes time for face to face visit via telemedicine, review of medical records, imaging studies and discussion of findings with providers, the patient and/or family.   Dr Suzi Roots Babette Stum   [facility] [phone]   Case [id number]

## 2018-11-11 NOTE — H&P (Addendum)
[place] [facility] [place] NAME: Ronald Adkins    MR#:  [id number]  DATE OF BIRTH:  1945-11-12  DATE OF ADMISSION:  11/11/2018  PRIMARY CARE PHYSICIAN: Rusty Aus, MD   REQUESTING/REFERRING PHYSICIAN: Nance Pear, MD  CHIEF COMPLAINT:   Chief Complaint  Patient presents with   Code Stroke    HISTORY OF PRESENT ILLNESS:  73 y.o. male with past medical history of prostate cancer, CVA, vascular dementia, and hypothyroidism presenting to the ED with complaints of double vision, dysarthria, and left facial droop.  Per patient's wife, patient was last seen normal today at around 12-13:00.  Patient's wife states that patient left home to go to the office without any symptoms.  Patient's wife tried to call him at work but he would not answer which was unusual for him, she therefore decided to go to the office to check on him.  Per patient's wife, patient did not answer the door for seral minutes and when he finally did, he walked past her without noticing her.  Patient's wife noticed that he was slurring his speech, with left facial droop and difficulty walking.  Patient apparently complained of blurred vision and just not feeling "right".  Patient got on his truck and drove home with wife following behind.  The patient's wife patient was driving erratically on the road and in the middle of lane. Patient's wife got concerned and decided to bring him to the ED.  On arrival to the ED, he was afebrile with blood pressure 224/114 mm Hg and pulse rate 81 beats/min.  He was noted with significant focal neurological deficits on the left (left facial droop and slurred speech); he was alert and oriented x4.  Code stroke was initiated and patient evaluated by tele-neurologist.  Initial NIH stroke scale 5.  Noncontrast CT head was obtained which showed no acute intracranial abnormality. Clinical presentation was not suggestive of large vessel occlusive disease  therefore patient was not a candidate for thrombectomy. He was  deemed candidate for tPA thrombolytics however patient declined intervention after risks and benefits explained to him.  Initial labs revealed unremarkable CBC and CMP.  Patient will be admitted under hospitalist service for further stroke work-up and management.  PAST MEDICAL HISTORY:   Past Medical History:  Diagnosis Date   Dementia ([place])    Thyroid disease     PAST SURGICAL HISTORY:  History reviewed. No pertinent surgical history.  SOCIAL HISTORY:   Social History   Tobacco Use   Smoking status: Never Smoker   Smokeless tobacco: Never Used  Substance Use Topics   Alcohol use: Yes    FAMILY HISTORY:  No family history on file.  DRUG ALLERGIES:  No Known Allergies  REVIEW OF SYSTEMS:   ROS unable to obtain from patient due to underlying dementia MEDICATIONS AT HOME:   Prior to Admission medications   Medication Sig Start Date End Date Taking? Authorizing Provider  aspirin EC 81 MG tablet Take 81 mg by mouth daily.   Yes [provider]  etodolac (LODINE) 400 MG tablet Take 400 mg by mouth 2 (two) times daily. 10/31/18  Yes [provider]  galantamine (RAZADYNE ER) 16 MG 24 hr capsule Take 16 mg by mouth daily. 10/31/18  Yes [provider]  levothyroxine (SYNTHROID) 100 MCG tablet Take 100 mcg by mouth daily. 10/24/18  Yes [provider]  Multiple Vitamin (MULTI-VITAMIN) tablet Take 1 tablet by mouth daily.  Yes [provider]  vitamin B-12 (CYANOCOBALAMIN) 1000 MCG tablet Take 1,000 mcg by mouth daily.   Yes [provider]      VITAL SIGNS:  Blood pressure (!) 130/93, pulse 79, resp. rate 19, height 6' (1.829 m), weight 86.2 kg, SpO2 95 %.  PHYSICAL EXAMINATION:   Physical Exam  GENERAL:  73 y.o.-year-old patient lying in the bed with no acute distress.  EYES: Pupils equal, round, reactive to light and accommodation. No scleral  icterus. Extraocular muscles intact.  HEENT: Head atraumatic, normocephalic. Oropharynx and nasopharynx clear.  NECK:  Supple, no jugular venous distention. No thyroid enlargement, no tenderness.  LUNGS: Normal breath sounds bilaterally, no wheezing, rales,rhonchi or crepitation. No use of accessory muscles of respiration.  CARDIOVASCULAR: S1, S2 normal. No murmurs, rubs, or gallops.  ABDOMEN: Soft, nontender, nondistended. Bowel sounds present. No organomegaly or mass.  EXTREMITIES: No pedal edema, cyanosis, or clubbing. No rash or lesions. + pedal pulses MUSCULOSKELETAL: Normal bulk, and power was 5+ grip and elbow, knee, and ankle flexion and extension bilaterally.  NEUROLOGIC:Alert and oriented x 3. CN 2-12 intact except left facial droop, moderate dysarthria, visual field deficit in the left.  Sensation to light touch and cold stimuli intact bilaterally.  Unable to perform finger to nose testing due to diplopia. Babinski is downgoing. DTR's (biceps, patellar, and achilles) 2+ and symmetric throughout. Gait not tested due to safety concern. PSYCHIATRIC: The patient is alert and oriented x 3.  SKIN: No obvious rash, lesion, or ulcer.   DATA REVIEWED:  LABORATORY PANEL:   CBC Recent Labs  Lab 11/11/18 1633  WBC 5.6  HGB 13.5  HCT 40.2  PLT 154   ------------------------------------------------------------------------------------------------------------------  Chemistries  Recent Labs  Lab 11/11/18 1633  NA 139  K 4.0  CL 108  CO2 23  GLUCOSE 96  BUN 28*  CREATININE 1.01  CALCIUM 8.9  AST 30  ALT 20  ALKPHOS 67  BILITOT 0.7   ------------------------------------------------------------------------------------------------------------------  Cardiac Enzymes No results for input(s): TROPONINI in the last 168 hours. ------------------------------------------------------------------------------------------------------------------  RADIOLOGY:  Ct Head Code Stroke Wo  Contrast  Result Date: 11/11/2018 CLINICAL DATA:  Code stroke. New onset of diplopia in slurred speech. Right facial droop. Last known well 3 hours ago. EXAM: CT HEAD WITHOUT CONTRAST TECHNIQUE: Contiguous axial images were obtained from the base of the skull through the vertex without intravenous contrast. COMPARISON:  CT head without contrast 05/17/2017. FINDINGS: Brain: Moderate diffuse white matter changes are stable. No acute cortical infarct is present. Basal ganglia are unchanged. No acute hemorrhage or mass lesion is present. No significant extraaxial fluid collection is present. The brainstem and cerebellum are within normal limits. Vascular: Moderate ectasia is present throughout the circle-of-Willis. Atherosclerotic calcifications are present within the cavernous internal carotid arteries and at the dural margin of the vertebral arteries. There is no asymmetric hyperdense vessel. Skull: Calvarium is intact. No focal lytic or blastic lesions are present. 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 Is chronic wall thickening. Bilateral anterior ethmoid mucosal thickening is present. This extends into the inferior left frontal sinus. There is fluid in the right sphenoid sinus. Mastoid air cells are clear. The globes and orbits are within normal limits. ASPECTS [facility] Stroke Program Early CT Score) - Ganglionic level infarction (caudate, lentiform nuclei, internal capsule, insula, M1-M3 cortex): 7/7 - Supraganglionic infarction (M4-M6 cortex): 3/3 Total score (0-10 with 10 being normal): 10/10 IMPRESSION: 1. No acute intracranial abnormality or significant interval change. 2. Stable  moderate diffuse white matter disease. This likely reflects the sequela of chronic microvascular ischemia. 3. Intracranial atherosclerosis moderate ectasia throughout the circle-of-Willis. 4. ASPECTS is 10/10 These results were called by telephone at the time of interpretation on 11/11/2018 at 4:17 pm to  [facility] , who verbally acknowledged these results. Electronically Signed   By: San Morelle M.D.   On: 11/11/2018 16:19   EKG:  EKG: normal EKG, normal sinus rhythm, unchanged from previous tracings. Vent. rate 69 BPM PR interval * ms QRS duration 92 ms QT/QTc 422/453 ms P-R-T axes 48 -54 41 IMPRESSION AND PLAN:   73 y.o. male past medical history of prostate cancer, CVA, vascular dementia, and hypothyroidism presenting to the ED with complaints of double vision, dysarthria, and left facial droop.  1.  Left facial droop, double vision and dysarthria - Concerns for ischemic event patient with history of prior CVA - Admit to medsurg unit - CT head negative for acute intracranial abnormality - Obtain CTA head and neck for evaluation of LVO - Check HgbA1c, fasting lipid panel - Obtain MRI  of the brain without contrast - PT consult, OT consult, Speech consult - Echocardiogram with bubble study - Patient was on aspirin 81 mg prior to this event.  Will start prophylactic therapy-dual therapy ASA 81mg  PO daily and Plavix 75 mg daily x21days then continue with monotherapy - NPO until RN stroke swallow screen -Telemetry monitoring - Frequent neuro checks - Neurology consult placed.  2. Hypothyroidism -continue Synthroid  3. Vascular dementia - Continue galantamine  4. GERD : PPI prophylaxis with Protonix  5. DVT prophylaxis - Enoxaparin SubQ   All the records are reviewed and case discussed with ED provider. Management plans discussed with the patient, family and they are in agreement.  CODE STATUS: FULL  TOTAL TIME TAKING CARE OF THIS PATIENT: 50 minutes.    on 11/11/2018 at 6:50 PM  Rufina Falco, DNP, FNP-BC [facility] Nurse Practitioner Between 7am to 6pm - Pager [phone]  After 6pm go to [email or web address] - password [email or web address] [facility]  Office  [phone]  CC: Primary care physician; Rusty Aus, MD

## 2018-11-11 NOTE — ED Notes (Signed)
Called by First Nurse to [phone]

## 2018-11-11 NOTE — ED Notes (Addendum)
ED TO INPATIENT HANDOFF REPORT  ED Nurse Name and Phone #: Karena Addison [phone] Name/Age/Gender Ronald Adkins 73 y.o. male Room/Bed: ED08A/ED08A  Code Status   Code Status: Full Code  Home/SNF/Other Home Patient oriented to: self, place, time and situation Is this baseline? Yes   Triage Complete: Triage complete  Chief Complaint Code Stroke  Triage Note No notes on file   Allergies No Known Allergies  Level of Care/Admitting Diagnosis ED Disposition    ED Disposition Condition [place]: [place] [[id number]]  Level of Care: Med-Surg [16]  Covid Evaluation: Asymptomatic Screening Protocol (No Symptoms)  [facility]: [id number]  Admitting Physician: Lang Snow [phone]  Attending Physician: Rufina Falco ACHIENG [phone]  Estimated length of stay: past midnight tomorrow  Certification:: I certify this patient will need inpatient services for at least 2 midnights  PT Class (Do Not Modify): Inpatient [101]  PT Acc Code (Do Not Modify): Private [1]       B Medical/Surgery History Past Medical History:  Diagnosis Date  . Dementia ([place])   . Thyroid disease    History reviewed. No pertinent surgical history.   A IV Location/Drains/Wounds Patient Lines/Drains/Airways Status   Active Line/Drains/Airways    Name:   Placement date:   Placement time:   Site:   Days:   Peripheral IV 11/11/18 Left Hand   11/11/18    1634    Hand   less than 1   Peripheral IV 11/11/18 Right Forearm   11/11/18    1643    Forearm   less than 1          Intake/Output Last 24 hours No intake or output data in the 24 hours ending 11/11/18 1906  Labs/Imaging Results for orders placed or performed during the hospital encounter of 11/11/18 (from the past 48 hour(s))  Glucose, capillary     Status: None   Collection Time: 11/11/18  4:09 PM  Result Value Ref Range   Glucose-Capillary 90 70 - 99  mg/dL  Protime-INR     Status: None   Collection Time: 11/11/18  4:33 PM  Result Value Ref Range   Prothrombin Time 12.5 11.4 - 15.2 seconds   INR 0.9 0.8 - 1.2    Comment: (NOTE) INR goal varies based on device and disease states. Performed at [facility], [address] [place], [place] [address]   APTT     Status: None   Collection Time: 11/11/18  4:33 PM  Result Value Ref Range   aPTT 32 24 - 36 seconds    Comment: Performed at [facility], [address] [place], [place] [address]  CBC     Status: None   Collection Time: 11/11/18  4:33 PM  Result Value Ref Range   WBC 5.6 4.0 - 10.5 K/uL   RBC 4.22 4.22 - 5.81 MIL/uL   Hemoglobin 13.5 13.0 - 17.0 g/dL   HCT 40.2 39.0 - 52.0 %   MCV 95.3 80.0 - 100.0 fL   MCH 32.0 26.0 - 34.0 pg   MCHC 33.6 30.0 - 36.0 g/dL   RDW 12.2 11.5 - 15.5 %   Platelets 154 150 - 400 K/uL   nRBC 0.0 0.0 - 0.2 %    Comment: Performed at [facility], [address], [place], [place] [address]  Differential     Status: None   Collection Time: 11/11/18  4:33 PM  Result Value Ref Range   Neutrophils Relative % 57 %   Neutro Abs 3.2 1.7 - 7.7 K/uL   Lymphocytes Relative 28 %   Lymphs Abs 1.6 0.7 - 4.0 K/uL   Monocytes Relative 10 %   Monocytes Absolute 0.6 0.1 - 1.0 K/uL   Eosinophils Relative 3 %   Eosinophils Absolute 0.2 0.0 - 0.5 K/uL   Basophils Relative 2 %   Basophils Absolute 0.1 0.0 - 0.1 K/uL   Immature Granulocytes 0 %   Abs Immature Granulocytes 0.01 0.00 - 0.07 K/uL    Comment: Performed at [facility], [address] [place], [place] [address]  Comprehensive metabolic panel     Status: Abnormal   Collection Time: 11/11/18  4:33 PM  Result Value Ref Range   Sodium 139 135 - 145 mmol/L   Potassium 4.0 3.5 - 5.1 mmol/L   Chloride 108 98 - 111 mmol/L   CO2 23 22 - 32 mmol/L   Glucose, Bld 96 70 - 99 mg/dL   BUN 28 (H) 8 - 23 mg/dL   Creatinine, Ser 1.01 0.61 - 1.24 mg/dL   Calcium  8.9 8.9 - 10.3 mg/dL   Total Protein 6.4 (L) 6.5 - 8.1 g/dL   Albumin 3.9 3.5 - 5.0 g/dL   AST 30 15 - 41 U/L   ALT 20 0 - 44 U/L   Alkaline Phosphatase 67 38 - 126 U/L   Total Bilirubin 0.7 0.3 - 1.2 mg/dL   GFR calc non Af Amer >60 >60 mL/min   GFR calc Af Amer >60 >60 mL/min   Anion gap 8 5 - 15    Comment: Performed at [facility], [address], [place], [place] [address]  SARS Coronavirus 2 [facility] order, Performed in [place] hospital lab)     Status: None   Collection Time: 11/11/18  5:06 PM  Result Value Ref Range   SARS Coronavirus 2 NEGATIVE NEGATIVE    Comment: (NOTE) If result is NEGATIVE SARS-CoV-2 target nucleic acids are NOT DETECTED. The SARS-CoV-2 RNA is generally detectable in upper and lower  respiratory specimens during the acute phase of infection. The lowest  concentration of SARS-CoV-2 viral copies this assay can detect is 250  copies / mL. A negative result does not preclude SARS-CoV-2 infection  and should not be used as the sole basis for treatment or other  patient management decisions.  A negative result may occur with  improper specimen collection / handling, submission of specimen other  than nasopharyngeal swab, presence of viral mutation(s) within the  areas targeted by this assay, and inadequate number of viral copies  (<250 copies / mL). A negative result must be combined with clinical  observations, patient history, and epidemiological information. If result is POSITIVE SARS-CoV-2 target nucleic acids are DETECTED. The SARS-CoV-2 RNA is generally detectable in upper and lower  respiratory specimens dur ing the acute phase of infection.  Positive  results are indicative of active infection with SARS-CoV-2.  Clinical  correlation with patient history and other diagnostic information is  necessary to determine patient infection status.  Positive results do  not rule out bacterial infection or co-infection with other viruses. If  result is PRESUMPTIVE POSTIVE SARS-CoV-2 nucleic acids MAY BE PRESENT.   A presumptive positive result was obtained on the submitted specimen  and confirmed on repeat testing.  While 2019 novel coronavirus  (SARS-CoV-2) nucleic acids may be present in the submitted sample  additional confirmatory testing may  be necessary for epidemiological  and / or clinical management purposes  to differentiate between  SARS-CoV-2 and other Sarbecovirus currently known to infect humans.  If clinically indicated additional testing with an alternate test  methodology [id number]) is advised. The SARS-CoV-2 RNA is generally  detectable in upper and lower respiratory sp ecimens during the acute  phase of infection. The expected result is Negative. Fact Sheet for Patients:  StrictlyIdeas.no Fact Sheet for Healthcare Providers: BankingDealers.co.za This test is not yet approved or cleared by the [place] FDA and has been authorized for detection and/or diagnosis of SARS-CoV-2 by FDA under an Emergency Use Authorization (EUA).  This EUA will remain in effect (meaning this test can be used) for the duration of the COVID-19 declaration under Section 564(b)(1) of the Act, 21 U.S.C. section 360bbb-3(b)(1), unless the authorization is terminated or revoked sooner. Performed at [facility], [address] [place], [place] [address]    Ct Head Code Stroke Wo Contrast  Result Date: 11/11/2018 CLINICAL DATA:  Code stroke. New onset of diplopia in slurred speech. Right facial droop. Last known well 3 hours ago. EXAM: CT HEAD WITHOUT CONTRAST TECHNIQUE: Contiguous axial images were obtained from the base of the skull through the vertex without intravenous contrast. COMPARISON:  CT head without contrast 05/17/2017. FINDINGS: Brain: Moderate diffuse white matter changes are stable. No acute cortical infarct is present. Basal ganglia are unchanged. No acute  hemorrhage or mass lesion is present. No significant extraaxial fluid collection is present. The brainstem and cerebellum are within normal limits. Vascular: Moderate ectasia is present throughout the circle-of-Willis. Atherosclerotic calcifications are present within the cavernous internal carotid arteries and at the dural margin of the vertebral arteries. There is no asymmetric hyperdense vessel. Skull: Calvarium is intact. No focal lytic or blastic lesions are present. 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 Is chronic wall thickening. Bilateral anterior ethmoid mucosal thickening is present. This extends into the inferior left frontal sinus. There is fluid in the right sphenoid sinus. Mastoid air cells are clear. The globes and orbits are within normal limits. ASPECTS [facility] Stroke Program Early CT Score) - Ganglionic level infarction (caudate, lentiform nuclei, internal capsule, insula, M1-M3 cortex): 7/7 - Supraganglionic infarction (M4-M6 cortex): 3/3 Total score (0-10 with 10 being normal): 10/10 IMPRESSION: 1. No acute intracranial abnormality or significant interval change. 2. Stable moderate diffuse white matter disease. This likely reflects the sequela of chronic microvascular ischemia. 3. Intracranial atherosclerosis moderate ectasia throughout the circle-of-Willis. 4. ASPECTS is 10/10 These results were called by telephone at the time of interpretation on 11/11/2018 at 4:17 pm to [facility] , who verbally acknowledged these results. Electronically Signed   By: San Morelle M.D.   On: 11/11/2018 16:19    Pending Labs Unresulted Labs (From admission, onward)    Start     Ordered   11/11/18 1825  Hemoglobin A1c  Add-on,   AD     11/11/18 1826   11/11/18 1825  Lipid panel  Add-on,   AD    Comments: Fasting    11/11/18 1826          Vitals/Pain Today's Vitals   11/11/18 1730 11/11/18 1753 11/11/18 1800 11/11/18 1818  BP: 122/88  (!) 130/93    Pulse:   79   Resp: 19  19   SpO2:   95%   Weight:    86.2 kg  Height:    6' (1.829 m)  PainSc:  0-No pain  0-No  pain    Isolation Precautions No active isolations  Medications Medications  sodium chloride flush (NS) 0.9 % injection 3 mL (has no administration in time range)  aspirin EC tablet 81 mg (has no administration in time range)  etodolac (LODINE) tablet 400 mg (has no administration in time range)  galantamine (RAZADYNE ER) 24 hr capsule 16 mg (has no administration in time range)  levothyroxine (SYNTHROID) tablet 100 mcg (has no administration in time range)  vitamin B-12 (CYANOCOBALAMIN) tablet 1,000 mcg (has no administration in time range)  Multi-Vitamin 1 tablet (has no administration in time range)   stroke: mapping our early stages of recovery book (has no administration in time range)  0.9 %  sodium chloride infusion (has no administration in time range)  acetaminophen (TYLENOL) tablet 650 mg (has no administration in time range)    Or  acetaminophen (TYLENOL) solution 650 mg (has no administration in time range)    Or  acetaminophen (TYLENOL) suppository 650 mg (has no administration in time range)  senna-docusate (Senokot-S) tablet 1 tablet (has no administration in time range)  enoxaparin (LOVENOX) injection 40 mg (has no administration in time range)  clopidogrel (PLAVIX) tablet 75 mg (has no administration in time range)  iohexol (OMNIPAQUE) 350 MG/ML injection 75 mL (75 mLs Intravenous Contrast Given 11/11/18 1846)    Mobility walks with person assist Low fall risk   Focused Assessments    R Recommendations: See Admitting Provider Note  Report given to: Marzetta Board, RN

## 2018-11-11 NOTE — ED Notes (Addendum)
FIRST NURSE NOTE:  Per family, pt found around 1500 today with double vision, slurred speech, difficulty walking, right facial droop, unable to walk.  Significant other states last know well between 12pm and 1pm, but reports closer to 1pm when patient left to go to the office.  SO states that when she saw the patient at the office they left and she followed him home he was driving erratically.   On arrival, pt was found to have difficulty walking, right facial droop and slurred speech. Pt having complaints of blurred vision as well.   CODE STROKE activated based on LKW of between 12p and 1pm.

## 2018-11-11 NOTE — ED Notes (Addendum)
Neurologist determined to give pt TPA and pt and pt spouse agreed to [facility]

## 2018-11-12 ENCOUNTER — Inpatient Hospital Stay (HOSPITAL_COMMUNITY)
Admit: 2018-11-12 | Discharge: 2018-11-12 | Disposition: A | Discharge status: Home / Self Care | Payer: PPO | Attending: Nurse Practitioner | Admitting: Nurse Practitioner

## 2018-11-12 DIAGNOSIS — I639 Cerebral infarction, unspecified: Secondary | ICD-10-CM

## 2018-11-12 DIAGNOSIS — I350 Nonrheumatic aortic (valve) stenosis: Secondary | ICD-10-CM

## 2018-11-12 MED ORDER — ATORVASTATIN CALCIUM 20 MG PO TABS
40.0000 mg | ORAL_TABLET | Freq: Every day | ORAL | Status: DC
  Administered 2018-11-12 – 2018-11-13 (×2): 40 mg via ORAL
  Filled 2018-11-12 (×2): qty 2

## 2018-11-12 NOTE — Consult Note (Signed)
Referring Physician: Mody    Chief Complaint: Facial droop, slurred speech, diplopia.    HPI: Ronald Adkins is an 73 y.o. male with past medical history of prostate cancer, CVA, vascular dementia, and hypothyroidism presenting to the ED with complaints of double vision, dysarthria, and left facial droop.  Patient was last seen normal yesterday at around noon when he left for work.  Wife tried to call him at work but he would not answer which was unusual for him, she therefore decided to go to the office to check on him.  When she arrived patient was slurring his speech, with left facial droop and difficulty walking.  Patient apparently complained of blurred vision and just not feeling "right".  Patient was brought in for evaluation at that time.  Date last known well: Date: 11/11/2018 Time last known well: Time: 12:00 tPA Given: No: Patient refusal  Past Medical History:  Diagnosis Date  . Dementia ([place])   . Thyroid disease     History reviewed. No pertinent surgical history.  No family history on file. Social History:  reports that he has never smoked. He has never used smokeless tobacco. He reports current alcohol use. He reports that he does not use drugs.  Allergies: No Known Allergies  Medications:  I have reviewed the patient's current medications. Prior to Admission:  Medications Prior to Admission  Medication Sig Dispense Refill Last Dose  . aspirin EC 81 MG tablet Take 81 mg by mouth daily.   11/11/2018 at 0800  . etodolac (LODINE) 400 MG tablet Take 400 mg by mouth 2 (two) times daily.   11/11/2018 at 0800  . galantamine (RAZADYNE ER) 16 MG 24 hr capsule Take 16 mg by mouth daily.   11/11/2018 at 0800  . levothyroxine (SYNTHROID) 100 MCG tablet Take 100 mcg by mouth daily.   11/11/2018 at 0800  . Multiple Vitamin (MULTI-VITAMIN) tablet Take 1 tablet by mouth daily.   11/11/2018 at 0800  . vitamin B-12 (CYANOCOBALAMIN) 1000 MCG tablet Take 1,000 mcg by mouth daily.   11/11/2018 at  0800   Scheduled: .  stroke: mapping our early stages of recovery book   Does not apply Once  . aspirin EC  81 mg Oral Daily  . clopidogrel  75 mg Oral Daily  . enoxaparin (LOVENOX) injection  40 mg Subcutaneous Q24H  . etodolac  400 mg Oral BID  . galantamine  16 mg Oral Daily  . levothyroxine  100 mcg Oral Daily  . multivitamin with minerals  1 tablet Oral Daily  . vitamin B-12  1,000 mcg Oral Daily    ROS: History obtained from the patient  General ROS: negative for - chills, fatigue, fever, night sweats, weight gain or weight loss Psychological ROS: negative for - behavioral disorder, hallucinations, memory difficulties, mood swings or suicidal ideation Ophthalmic ROS: negative for - blurry vision, double vision, eye pain or loss of vision ENT ROS: negative for - epistaxis, nasal discharge, oral lesions, sore throat, tinnitus or vertigo Allergy and Immunology ROS: negative for - hives or itchy/watery eyes Hematological and Lymphatic ROS: negative for - bleeding problems, bruising or swollen lymph nodes Endocrine ROS: negative for - galactorrhea, hair pattern changes, polydipsia/polyuria or temperature intolerance Respiratory ROS: negative for - cough, hemoptysis, shortness of breath or wheezing Cardiovascular ROS: negative for - chest pain, dyspnea on exertion, edema or irregular heartbeat Gastrointestinal ROS: negative for - abdominal pain, diarrhea, hematemesis, nausea/vomiting or stool incontinence Genito-Urinary ROS: negative for - dysuria, hematuria,  incontinence or urinary frequency/urgency Musculoskeletal ROS: negative for - joint swelling or muscular weakness Neurological ROS: as noted in HPI Dermatological ROS: negative for rash and skin lesion changes  Physical Examination: Blood pressure 120/79, pulse 66, temperature 98.2 F (36.8 C), temperature source Oral, resp. rate 16, height 6' (1.829 m), weight 86.2 kg, SpO2 95 %.  HEENT-  Normocephalic, no lesions, without  obvious abnormality.  Normal external eye and conjunctiva.  Normal TM's bilaterally.  Normal auditory canals and external ears. Normal external nose, mucus membranes and septum.  Normal pharynx. Cardiovascular- S1, S2 normal, pulses palpable throughout   Lungs- chest clear, no wheezing, rales, normal symmetric air entry Abdomen- soft, non-tender; bowel sounds normal; no masses,  no organomegaly Extremities- no edema Lymph-no adenopathy palpable Musculoskeletal-no joint tenderness, deformity or swelling Skin-warm and dry, no hyperpigmentation, vitiligo, or suspicious lesions  Neurological Examination   Mental Status: Alert, oriented, thought content appropriate.  Speech fluent without evidence of aphasia.  Able to follow 3 step commands without difficulty. Cranial Nerves: II: Visual fields grossly normal, pupils equal, round, reactive to light and accommodation III,IV, VI: ptosis not present, extra-ocular motions intact bilaterally.  Reports diplopia on distant gaze V,VII: mild decrease in left NLF, facial light touch sensation normal bilaterally VIII: hearing normal bilaterally IX,X: gag reflex present XI: bilateral shoulder shrug XII: midline tongue extension Motor: Right : Upper extremity   5/5    Left:     Upper extremity   5/5  Lower extremity   5/5     Lower extremity   5/5 Tone and bulk:normal tone throughout; no atrophy noted Sensory: Pinprick and light touch intact throughout, bilaterally Deep Tendon Reflexes: Symmetric throughout Plantars: Right: upgoing   Left: upgoing Cerebellar: Normal finger-to-nose testing bilaterally.  Dysmetria with heel-to-shin testing on the left Gait: not tested due to safety concerns    Laboratory Studies:  Basic Metabolic Panel: Recent Labs  Lab 11/11/18 1633  NA 139  K 4.0  CL 108  CO2 23  GLUCOSE 96  BUN 28*  CREATININE 1.01  CALCIUM 8.9    Liver Function Tests: Recent Labs  Lab 11/11/18 1633  AST 30  ALT 20  ALKPHOS 67   BILITOT 0.7  PROT 6.4*  ALBUMIN 3.9   No results for input(s): LIPASE, AMYLASE in the last 168 hours. No results for input(s): AMMONIA in the last 168 hours.  CBC: Recent Labs  Lab 11/11/18 1633  WBC 5.6  NEUTROABS 3.2  HGB 13.5  HCT 40.2  MCV 95.3  PLT 154    Cardiac Enzymes: No results for input(s): CKTOTAL, CKMB, CKMBINDEX, TROPONINI in the last 168 hours.  BNP: Invalid input(s): POCBNP  CBG: Recent Labs  Lab 11/11/18 1609  GLUCAP 90    Microbiology: Results for orders placed or performed during the hospital encounter of 11/11/18  SARS Coronavirus 2 [facility] order, Performed in [facility] hospital lab)     Status: None   Collection Time: 11/11/18  5:06 PM  Result Value Ref Range Status   SARS Coronavirus 2 NEGATIVE NEGATIVE Final    Comment: (NOTE) If result is NEGATIVE SARS-CoV-2 target nucleic acids are NOT DETECTED. The SARS-CoV-2 RNA is generally detectable in upper and lower  respiratory specimens during the acute phase of infection. The lowest  concentration of SARS-CoV-2 viral copies this assay can detect is 250  copies / mL. A negative result does not preclude SARS-CoV-2 infection  and should not be used as the sole basis for treatment or other  patient management decisions.  A negative result may occur with  improper specimen collection / handling, submission of specimen other  than nasopharyngeal swab, presence of viral mutation(s) within the  areas targeted by this assay, and inadequate number of viral copies  (<250 copies / mL). A negative result must be combined with clinical  observations, patient history, and epidemiological information. If result is POSITIVE SARS-CoV-2 target nucleic acids are DETECTED. The SARS-CoV-2 RNA is generally detectable in upper and lower  respiratory specimens dur ing the acute phase of infection.  Positive  results are indicative of active infection with SARS-CoV-2.  Clinical  correlation with patient history  and other diagnostic information is  necessary to determine patient infection status.  Positive results do  not rule out bacterial infection or co-infection with other viruses. If result is PRESUMPTIVE POSTIVE SARS-CoV-2 nucleic acids MAY BE PRESENT.   A presumptive positive result was obtained on the submitted specimen  and confirmed on repeat testing.  While 2019 novel coronavirus  (SARS-CoV-2) nucleic acids may be present in the submitted sample  additional confirmatory testing may be necessary for epidemiological  and / or clinical management purposes  to differentiate between  SARS-CoV-2 and other Sarbecovirus currently known to infect humans.  If clinically indicated additional testing with an alternate test  methodology [phone]) is advised. The SARS-CoV-2 RNA is generally  detectable in upper and lower respiratory sp ecimens during the acute  phase of infection. The expected result is Negative. Fact Sheet for Patients:  StrictlyIdeas.no Fact Sheet for Healthcare Providers: BankingDealers.co.za This test is not yet approved or cleared by the [place] FDA and has been authorized for detection and/or diagnosis of SARS-CoV-2 by FDA under an Emergency Use Authorization (EUA).  This EUA will remain in effect (meaning this test can be used) for the duration of the COVID-19 declaration under Section 564(b)(1) of the Act, 21 U.S.C. section 360bbb-3(b)(1), unless the authorization is terminated or revoked sooner. Performed at [facility], [address] [place], [place] [address]     Coagulation Studies: Recent Labs    11/11/18 1633  LABPROT 12.5  INR 0.9    Urinalysis: No results for input(s): COLORURINE, LABSPEC, PHURINE, GLUCOSEU, HGBUR, BILIRUBINUR, KETONESUR, PROTEINUR, UROBILINOGEN, NITRITE, LEUKOCYTESUR in the last 168 hours.  Invalid input(s): APPERANCEUR  Lipid Panel:    Component Value Date/Time    CHOL 149 11/11/2018 1633   TRIG 51 11/11/2018 1633   HDL 50 11/11/2018 1633   CHOLHDL 3.0 11/11/2018 1633   VLDL 10 11/11/2018 1633   LDLCALC 89 11/11/2018 1633    HgbA1C:  Lab Results  Component Value Date   HGBA1C 5.5 11/11/2018    Urine Drug Screen:  No results found for: LABOPIA, COCAINSCRNUR, LABBENZ, AMPHETMU, THCU, LABBARB  Alcohol Level: No results for input(s): ETH in the last 168 hours.  Other results: EKG: sinus rhythm at 69 bpm.  Imaging: Ct Angio Head W Or Wo Contrast  Result Date: 11/11/2018 CLINICAL DATA:  Focal neuro deficit for greater than 6 hours. Diplopia. Slurred speech. Difficulty walking. Right facial droop. EXAM: CT ANGIOGRAPHY HEAD AND NECK TECHNIQUE: Multidetector CT imaging of the head and neck was performed using the standard protocol during bolus administration of intravenous contrast. Multiplanar CT image reconstructions and MIPs were obtained to evaluate the vascular anatomy. Carotid stenosis measurements (when applicable) are obtained utilizing NASCET criteria, using the distal internal carotid diameter as the denominator. CONTRAST:  9mL OMNIPAQUE IOHEXOL 350 MG/ML SOLN COMPARISON:  None. FINDINGS: CTA NECK  FINDINGS Aortic arch: A 3 vessel arch configuration is present. Minimal atherosclerotic calcifications are present in the aortic arch. There is some tortuosity of the arch without aneurysm. Minimal calcification is present in the origins of the innominate and left common carotid artery without significant stenosis. Right carotid system: The right common carotid artery is mildly tortuous. Minimal calcifications are present at the carotid bifurcation and proximal right ICA without significant stenosis. There is mild tortuosity of the cervical right ICA without significant stenosis. Left carotid system: The left common carotid artery is within normal limits. Atherosclerotic calcifications are present at the left carotid bifurcation and proximal left ICA without  a significant stenosis relative to the more distal vessel. Mild tortuosity is present. Vertebral arteries: The left vertebral artery is the dominant vessel. Both vertebral arteries originate from the subclavian arteries. Atherosclerotic calcifications are present at the origin of the left vertebral artery without a significant stenosis relative to the more distal vessel. There is no significant stenosis of either vertebral artery in the neck. Skeleton: Slight anterolisthesis at C4-5 is degenerative. Chronic loss of disc height is present at C5-6 and C6-7. Degenerative changes extend into the thoracic spine. Leftward curvature is present at the cervicothoracic junction. Rightward curvature is centered at T5-6 Other neck: The soft tissues the neck are otherwise unremarkable. Upper chest: Mild ground-glass attenuation likely reflects atelectasis. Review of the MIP images confirms the above findings CTA HEAD FINDINGS Anterior circulation: The internal carotid arteries are within normal limits from the skull base through the ICA termini. The A1 and M1 segments are normal. Anterior communicating artery is patent. MCA bifurcations are intact. The ACA and MCA branch vessels are within normal limits. Posterior circulation: The vertebral arteries are codominant. PICA origins are visualized and normal. The vertebrobasilar junction is normal. Basilar artery is mildly ectatic. Both posterior cerebral arteries originate from the basilar tip. The PCA branch vessels are within normal limits. Venous sinuses: The dural sinuses are patent. Straight sinus deep cerebral veins are intact. Cortical veins are unremarkable. There is no significant vascular malformation. Anatomic variants: None Review of the MIP images confirms the above findings IMPRESSION: 1. No emergent large vessel occlusion. 2. Tortuosity of the cervical vasculature without significant stenosis. This is nonspecific, but commonly seen in the setting of chronic  hypertension. 3. Degenerative changes of the cervical spine. Electronically Signed   By: San Morelle M.D.   On: 11/11/2018 20:02   Ct Angio Neck W Or Wo Contrast  Result Date: 11/11/2018 CLINICAL DATA:  Focal neuro deficit for greater than 6 hours. Diplopia. Slurred speech. Difficulty walking. Right facial droop. EXAM: CT ANGIOGRAPHY HEAD AND NECK TECHNIQUE: Multidetector CT imaging of the head and neck was performed using the standard protocol during bolus administration of intravenous contrast. Multiplanar CT image reconstructions and MIPs were obtained to evaluate the vascular anatomy. Carotid stenosis measurements (when applicable) are obtained utilizing NASCET criteria, using the distal internal carotid diameter as the denominator. CONTRAST:  50mL OMNIPAQUE IOHEXOL 350 MG/ML SOLN COMPARISON:  None. FINDINGS: CTA NECK FINDINGS Aortic arch: A 3 vessel arch configuration is present. Minimal atherosclerotic calcifications are present in the aortic arch. There is some tortuosity of the arch without aneurysm. Minimal calcification is present in the origins of the innominate and left common carotid artery without significant stenosis. Right carotid system: The right common carotid artery is mildly tortuous. Minimal calcifications are present at the carotid bifurcation and proximal right ICA without significant stenosis. There is mild tortuosity of the cervical right  ICA without significant stenosis. Left carotid system: The left common carotid artery is within normal limits. Atherosclerotic calcifications are present at the left carotid bifurcation and proximal left ICA without a significant stenosis relative to the more distal vessel. Mild tortuosity is present. Vertebral arteries: The left vertebral artery is the dominant vessel. Both vertebral arteries originate from the subclavian arteries. Atherosclerotic calcifications are present at the origin of the left vertebral artery without a significant  stenosis relative to the more distal vessel. There is no significant stenosis of either vertebral artery in the neck. Skeleton: Slight anterolisthesis at C4-5 is degenerative. Chronic loss of disc height is present at C5-6 and C6-7. Degenerative changes extend into the thoracic spine. Leftward curvature is present at the cervicothoracic junction. Rightward curvature is centered at T5-6 Other neck: The soft tissues the neck are otherwise unremarkable. Upper chest: Mild ground-glass attenuation likely reflects atelectasis. Review of the MIP images confirms the above findings CTA HEAD FINDINGS Anterior circulation: The internal carotid arteries are within normal limits from the skull base through the ICA termini. The A1 and M1 segments are normal. Anterior communicating artery is patent. MCA bifurcations are intact. The ACA and MCA branch vessels are within normal limits. Posterior circulation: The vertebral arteries are codominant. PICA origins are visualized and normal. The vertebrobasilar junction is normal. Basilar artery is mildly ectatic. Both posterior cerebral arteries originate from the basilar tip. The PCA branch vessels are within normal limits. Venous sinuses: The dural sinuses are patent. Straight sinus deep cerebral veins are intact. Cortical veins are unremarkable. There is no significant vascular malformation. Anatomic variants: None Review of the MIP images confirms the above findings IMPRESSION: 1. No emergent large vessel occlusion. 2. Tortuosity of the cervical vasculature without significant stenosis. This is nonspecific, but commonly seen in the setting of chronic hypertension. 3. Degenerative changes of the cervical spine. Electronically Signed   By: San Morelle M.D.   On: 11/11/2018 20:02   Mr Brain Wo Contrast  Result Date: 11/11/2018 CLINICAL DATA:  Slurred speech. Difficulty walking. Right facial droop. EXAM: MRI HEAD WITHOUT CONTRAST TECHNIQUE: Multiplanar, multiecho pulse  sequences of the brain and surrounding structures were obtained without intravenous contrast. COMPARISON:  CTA head and neck and CT head without contrast 11/11/2018 FINDINGS: Brain: The diffusion-weighted images demonstrate acute nonhemorrhagic infarct involving the right anterior thalamus measuring up to 8 mm. Linear 6 mm infarct is present in the posterior left temporal lobe on image 25 series 5. A 10 mm left occipital cortical infarct is present. A punctate subcortical right occipital infarct is present. A 3 mm acute nonhemorrhagic white matter infarct is present in the right parietal lobe. Confluent periventricular and subcortical T2 hyperintensities are present bilaterally. There are remote lacunar infarcts involving the basal ganglia bilaterally. White matter changes extend into the brainstem. Remote lacunar infarcts are present in the cerebellum bilaterally. Vascular: Flow is present in the major intracranial arteries. Skull and upper cervical spine: The craniocervical junction is normal. Upper cervical spine is within normal limits. Marrow signal is unremarkable. Sinuses/Orbits: Fluid is present in the right sphenoid sinus. Mucosal disease is present in the maxillary sinuses and anterior ethmoid air cells bilaterally. The mastoid air cells are clear. The globes and orbits are within normal limits. IMPRESSION: 1. 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 2. 8 mm acute nonhemorrhagic infarct in the anterior right thalamus may involve the internal capsule 3. Acute infarct in the left occipital lobe. 4. Small acute  infarcts in the posterior left temporal lobe, right occipital lobe, and high right parietal white matter. 5. 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 6. Sinus disease as described. Electronically Signed   By: San Morelle  M.D.   On: 11/11/2018 21:39   Ct Head Code Stroke Wo Contrast  Result Date: 11/11/2018 CLINICAL DATA:  Code stroke. New onset of diplopia in slurred speech. Right facial droop. Last known well 3 hours ago. EXAM: CT HEAD WITHOUT CONTRAST TECHNIQUE: Contiguous axial images were obtained from the base of the skull through the vertex without intravenous contrast. COMPARISON:  CT head without contrast 05/17/2017. FINDINGS: Brain: Moderate diffuse white matter changes are stable. No acute cortical infarct is present. Basal ganglia are unchanged. No acute hemorrhage or mass lesion is present. No significant extraaxial fluid collection is present. The brainstem and cerebellum are within normal limits. Vascular: Moderate ectasia is present throughout the circle-of-Willis. Atherosclerotic calcifications are present within the cavernous internal carotid arteries and at the dural margin of the vertebral arteries. There is no asymmetric hyperdense vessel. Skull: Calvarium is intact. No focal lytic or blastic lesions are present. 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 Is chronic wall thickening. Bilateral anterior ethmoid mucosal thickening is present. This extends into the inferior left frontal sinus. There is fluid in the right sphenoid sinus. Mastoid air cells are clear. The globes and orbits are within normal limits. ASPECTS [facility] Stroke Program Early CT Score) - Ganglionic level infarction (caudate, lentiform nuclei, internal capsule, insula, M1-M3 cortex): 7/7 - Supraganglionic infarction (M4-M6 cortex): 3/3 Total score (0-10 with 10 being normal): 10/10 IMPRESSION: 1. No acute intracranial abnormality or significant interval change. 2. Stable moderate diffuse white matter disease. This likely reflects the sequela of chronic microvascular ischemia. 3. Intracranial atherosclerosis moderate ectasia throughout the circle-of-Willis. 4. ASPECTS is 10/10 These results were called by telephone  at the time of interpretation on 11/11/2018 at 4:17 pm to [facility] , who verbally acknowledged these results. Electronically Signed   By: San Morelle M.D.   On: 11/11/2018 16:19    Assessment: 73 y.o. male with past medical history of prostate cancer, CVA, vascular dementia, and hypothyroidism presenting with complaints of double vision, dysarthria, and left facial droop.  Patient reports improvement in symptoms today.  Some continued mild focality on neurological examination.  MRI of the brain reviewed and shows multiple, small, acute bihemispheric infarcts.  Suspect embolic etiology.  Patient on ASA prior to admission.  CTA shows no hemodynamically significant stenosis.  A1c 5.5, LDL 89.    Stroke Risk Factors - none  Plan: 1. Statin for lipid management with target LDL<70. 2. PT consult, OT consult, Speech consult 3. Echocardiogram pending 4. Prophylactic therapy-Dual antiplatelet therapy with ASA 81mg  and Plavix 75mg  for three weeks with change to Plavix 75mg  alone as monotherapy after that time. 5. NPO until RN stroke swallow screen 6. Telemetry monitoring 7. Frequent neuro checks 8. Smoking cessation counseling   Alexis Goodell, MD Neurology [phone] 11/12/2018, 10:17 AM

## 2018-11-12 NOTE — Evaluation (Signed)
Physical Therapy Evaluation Patient Details Name: Ronald Adkins MRN: [id number] DOB: 1946/01/27 Today's Date: 11/12/2018   History of Present Illness  Pt is a 73 y.o. male presenting to hospital 11/11/18 with double vision, dysarthria, and L facial droop.  MRI brain showing multi-focal acute nonhemorrhagic infarcts in various vascular distribution suggesting a central embolic source (8 mm acute nonhemorrhagic infarct anterior R thalamus-may involve internal capsule; acute infarct L occipital lobe; small infarcts acute posterior L temporal lobe, and R occipital lobe, and high R parietal white matter.  PMH includes prostate CA, CVA, and vascular dementia.  Clinical Impression  Prior to hospital admission, pt was independent with functional mobility.  Pt initially reporting that he lives alone and is separated from his wife; pt's wife arrived and reported they live together and are married (pt's wife also reported different house set-up than pt initially gave PT).  Pt talking about going home last night and needed multiple cues that he was at the hospital last night.  Oriented to person, place, and situation; reported it was the 10th and was 2019.  Pt's wife reports pt likes to joke around and therapist educated pt that in order to get accurate assessment, it would be helpful to not joke around during assessment activities (difficult to determine at times if pt was being truthful with his answers and was confused vs if pt was just joking around).  Appeared to have decreased insight regarding current condition at times.  Currently pt is modified independent with bed mobility; CGA with transfers; and CGA ambulating 200 feet no AD (overall pt steady although pt with noted loss of balance with turns but pt able to self correct each time).  Pt would benefit from skilled PT to address noted impairments and functional limitations (see below for any additional details).  Upon hospital discharge, recommend pt  discharge to OP PT for higher level balance activities.    Follow Up Recommendations Outpatient PT    Equipment Recommendations  None recommended by PT    Recommendations for Other Services OT consult     Precautions / Restrictions Precautions Precautions: Fall Restrictions Weight Bearing Restrictions: No      Mobility  Bed Mobility Overal bed mobility: Modified Independent             General bed mobility comments: Semi-supine to/from sit without any noted difficulties  Transfers Overall transfer level: Needs assistance Equipment used: None Transfers: Sit to/from Stand;Stand Pivot Transfers Sit to Stand: Min guard Stand pivot transfers: Min guard       General transfer comment: CGA for safety; overall steady with transfers  Ambulation/Gait Ambulation/Gait assistance: Min guard Gait Distance (Feet): 200 Feet Assistive device: None Gait Pattern/deviations: Step-through pattern Gait velocity: mildly decreased   General Gait Details: step through gait pattern; occasional unsteadiness with turns but pt able to self correct  Stairs            Wheelchair Mobility    Modified Rankin (Stroke Patients Only)       Balance Overall balance assessment: Needs assistance Sitting-balance support: No upper extremity supported;Feet supported Sitting balance-Leahy Scale: Normal Sitting balance - Comments: steady sitting reaching outside BOS   Standing balance support: No upper extremity supported Standing balance-Leahy Scale: Good Standing balance comment: steady standing urinating at toilet (pt's wife present with CGA for safety) and washing hands at sink               High Level Balance Comments: overall stead with ambulating and head  turns R/L in hallway             Pertinent Vitals/Pain Pain Assessment: No/denies pain  Vitals (HR and O2 on room air) stable and WFL throughout treatment session.    Home Living Family/patient expects to be  discharged to:: Private residence Living Arrangements: Spouse/significant other Available Help at Discharge: Family Type of Home: House Home Access: Stairs to enter   [employer] of Steps: [place]: Two level;Able to live on main level with bedroom/bathroom        Prior Function Level of Independence: Independent               Hand Dominance   Dominant Hand: Right    Extremity/Trunk Assessment   Upper Extremity Assessment Upper Extremity Assessment: Defer to OT evaluation    Lower Extremity Assessment Lower Extremity Assessment: (Intact B LE sensation, tone, proprioception, coordination, and strength)    Cervical / Trunk Assessment Cervical / Trunk Assessment: Normal  Communication   Communication: No difficulties  Cognition Arousal/Alertness: Awake/alert Behavior During Therapy: Impulsive Overall Cognitive Status: Impaired/Different from baseline Area of Impairment: Attention;Memory;Safety/judgement;Following commands;Awareness;Problem solving                   Current Attention Level: Selective Memory: Decreased short-term memory Following Commands: Follows multi-step commands inconsistently Safety/Judgement: Decreased awareness of deficits Awareness: Anticipatory Problem Solving: Requires verbal cues;Slow processing        General Comments   Nursing cleared pt for participation in physical therapy.  Pt agreeable to PT session.  Pt's wife present 2nd part of session.    Exercises     Assessment/Plan    PT Assessment Patient needs continued PT services  PT Problem List Decreased strength;Decreased balance;Decreased mobility;Decreased cognition;Decreased safety awareness;Decreased knowledge of precautions       PT Treatment Interventions DME instruction;Gait training;Stair training;Functional mobility training;Therapeutic activities;Therapeutic exercise;Balance training;Neuromuscular re-education;Cognitive  remediation;Patient/family education    PT Goals (Current goals can be found in the Care Plan section)  Acute Rehab PT Goals Patient Stated Goal: to go home PT Goal Formulation: With patient Time For Goal Achievement: 11/26/18 Potential to Achieve Goals: Good    Frequency 7X/week   Barriers to discharge        Co-evaluation               AM-PAC PT "6 Clicks" Mobility  Outcome Measure Help needed turning from your back to your side while in a flat bed without using bedrails?: None Help needed moving from lying on your back to sitting on the side of a flat bed without using bedrails?: None Help needed moving to and from a bed to a chair (including a wheelchair)?: A Little Help needed standing up from a chair using your arms (e.g., wheelchair or bedside chair)?: A Little Help needed to walk in hospital room?: A Little Help needed climbing 3-5 steps with a railing? : A Little 6 Click Score: 20    End of Session Equipment Utilized During Treatment: Gait belt Activity Tolerance: Patient tolerated treatment well Patient left: in bed;with call bell/phone within reach;with bed alarm set;with family/visitor present Nurse Communication: Mobility status;Precautions PT Visit Diagnosis: Unsteadiness on feet (R26.81);Muscle weakness (generalized) (M62.81)    Time: 0959(pt's breakfast came and pt requesting to eat so PT left and returned to finish eval [phone] PT Time Calculation (min) (ACUTE ONLY): 10 min   Charges:   PT Evaluation $PT Eval Low Complexity: 1 Low PT Treatments $Therapeutic Activity: 8-22 mins  Leitha Bleak, PT 11/12/18, 12:01 PM [phone]

## 2018-11-12 NOTE — Progress Notes (Signed)
Ch visited pt to provided AD education. Wife was at bedside upon ch arrival. Pt declined the AD education and also presented to have altered mental status (confimred by wife).  No further needs a this time.    11/12/18 1100  Clinical Encounter Type  Visited With Patient and family together  Visit Type Other (Comment) (AD education )  Referral From Physician  Consult/Referral To Chaplain  Stress Factors  Patient Stress Factors Health changes  Family Stress Factors None identified  Advance Directives (For Healthcare)  Does Patient Have a Medical Advance Directive? No  Would patient like information on creating a medical advance directive? No - Patient declined

## 2018-11-12 NOTE — Progress Notes (Addendum)
Family Meeting Note  Advance Directive: no Today a meeting took place with the Patient.  The following clinical team members were present during this meeting:MD  The following were discussed:Patient's diagnosis: cva, Patient's progosis: Unable to determine and Goals for treatment: DNR  Additional follow-up to be provided: DNR in chart and chaplain to create AD Rachel  Time spent during discussion:16 minutes  Bettey Costa, MD

## 2018-11-12 NOTE — Evaluation (Addendum)
Clinical/Bedside Swallow Evaluation Patient Details  Name: Ronald Adkins MRN: [id number] Date of Birth: [birth year]  Today's Date: 11/12/2018 Time: SLP Start Time (ACUTE ONLY): [id number] SLP Stop Time (ACUTE ONLY): 1015 SLP Time Calculation (min) (ACUTE ONLY): 50 min  Past Medical History:  Past Medical History:  Diagnosis Date  . Dementia ([place])   . Thyroid disease    Past Surgical History: History reviewed. No pertinent surgical history. HPI:  Pt is a 73 y.o. male with past medical history of prostate cancer, CVA, vascular dementia, and hypothyroidism presenting to the ED with complaints of double vision, dysarthria, and left facial droop. MRI revealed: 8 mm acute nonhemorrhagic infarct in the anterior right thalamus may involve the internal capsule; Acute infarct in the left occipital lobe; Small acute infarcts in the posterior left temporal lobe, right occipital lobe, and high right parietal white matter; Diffuse white matter disease and remote lacunar infarcts. Patient's wife noticed that he was slurring his speech, with left facial droop and difficulty walking. Patient apparently complained of blurred vision and just not feeling "right". Pt attempted to drive home but drove irratically so they came to the ED.   Assessment / Plan / Recommendation Clinical Impression  Pt appears to present w/ slight decline in pharyngeal phase swallowing function (moreso w/ thin liquids) but when following general aspiration precautions including small, single sips and NOT using straws, his risk for aspiration is reduced. Pt required min verbal cues for follow through w/ precautions and need to position more upright in bed for po's (impact from Vascular Dementia baseline suspected). W/ setup and min cues for follow through, he fed himself po trials w/ no immediate, overt s/s of aspiration noted - no decline in vocal quality or respiratory status. When he attempted multiple larger sips (appeared), he exhibited  mild, delayed throat clearing. Discussed this w/ pt and the difference in his swallowing when using the precautions (and the concern for consequences if he did not use the precautions). During the oral phase, pt exhibited appropriate oral control w/ no anterior leakage/bolus loss, no oral residue. Timely A-P transfer noted w/ all trial consistencies. OM exam revealed a slight L facial/labial tone decrease; lingual strength/ROM appropriate in individual movements. Recommend a regular diet w/ thin liquids w/ aspiration precautions and NO straw use; Pills in Puree (whole) for safer swallowing. Recommend monitoring during meals for need for cues for follow through w/ precautions (d/t baseline carry over concerns sec. to impact from Dementia). ST services will f/u w/ pt tomorrow; Dysarthria evaluation (min+ but moreso in connected speech). NSG and MD updated.  SLP Visit Diagnosis: Dysphagia, pharyngeal phase (R13.13)(Cognitive decline baseline)    Aspiration Risk  Mild aspiration risk(reduced following precautions)    Diet Recommendation  Regular diet w/ thin liquids; aspiration precautions including NO Straws. Monitoring at meals for follow through w/ precautions.  Medication Administration: Whole meds with puree(for safer swallowing)    Other  Recommendations Recommended Consults: (n/a) Oral Care Recommendations: Oral care BID;Patient independent with oral care Other Recommendations: (n/a)   Follow up Recommendations None      Frequency and Duration min 2x/week  1 week       Prognosis Prognosis for Safe Diet Advancement: Good Barriers to Reach Goals: Cognitive deficits;Severity of deficits(baseline)      Swallow Study   General Date of Onset: 11/11/18 HPI: Pt is a 73 y.o. male with past medical history of prostate cancer, CVA, vascular dementia, and hypothyroidism presenting to the ED with complaints of double  vision, dysarthria, and left facial droop. MRI revealed: 8 mm acute  nonhemorrhagic infarct in the anterior right thalamus may involve the internal capsule; Acute infarct in the left occipital lobe; Small acute infarcts in the posterior left temporal lobe, right occipital lobe, and high right parietal white matter; Diffuse white matter disease and remote lacunar infarcts. Patient's wife noticed that he was slurring his speech, with left facial droop and difficulty walking. Patient apparently complained of blurred vision and just not feeling "right". Pt attempted to drive home but drove irratically so they came to the ED. Type of Study: Bedside Swallow Evaluation Previous Swallow Assessment: none reported Diet Prior to this Study: NPO(regular diet at home) Temperature Spikes Noted: No(wbc 5.6) Respiratory Status: Room air History of Recent Intubation: No Behavior/Cognition: Alert;Cooperative;Pleasant mood;Distractible;Requires cueing(baseline Vascular Dementia) Oral Cavity Assessment: Dry(min) Oral Care Completed by SLP: Yes Oral Cavity - Dentition: Adequate natural dentition Vision: Functional for self-feeding Self-Feeding Abilities: Able to feed self;Needs assist;Needs set up Patient Positioning: Upright in bed(needed cues to position more upright) Baseline Vocal Quality: Normal Volitional Cough: Strong Volitional Swallow: Able to elicit    Oral/Motor/Sensory Function Overall Oral Motor/Sensory Function: Mild impairment(slight) Facial ROM: Reduced left Facial Symmetry: Abnormal symmetry left Facial Strength: Reduced left Facial Sensation: Within Functional Limits Lingual ROM: Within Functional Limits Lingual Symmetry: Within Functional Limits Lingual Strength: Within Functional Limits Lingual Sensation: Within Functional Limits Velum: Within Functional Limits Mandible: Within Functional Limits   Ice Chips Ice chips: Within functional limits Presentation: Spoon(fed; 3 trials)   Thin Liquid Thin Liquid: Impaired Presentation: Cup;Self Fed(~8 ozs  total) Oral Phase Impairments: (none) Oral Phase Functional Implications: (none) Pharyngeal  Phase Impairments: Throat Clearing - Delayed(x2 when taking multiple sips of water) Other Comments: instructed to slow down taking smaller, single sips    Nectar Thick Nectar Thick Liquid: Not tested   Honey Thick Honey Thick Liquid: Not tested   Puree Puree: Within functional limits Presentation: Self Fed;Spoon(4 ozs)   Solid     Solid: Within functional limits Presentation: Self Fed(8 trials/crackers)       Orinda Kenner, MS, CCC-SLP Rino Hosea 11/12/2018,12:29 PM

## 2018-11-12 NOTE — Progress Notes (Signed)
[facility] NAME: Ronald Adkins    MR#:  [id number]  DATE OF BIRTH:  Nov 09, 1945  SUBJECTIVE:   Patient presented with facial droop diplopia and slurred speech  REVIEW OF SYSTEMS:    Review of Systems  Constitutional: Negative for fever, chills weight loss HENT: Negative for ear pain, nosebleeds, congestion, facial swelling, rhinorrhea, neck pain, neck stiffness and ear discharge.   Respiratory: Negative for cough, shortness of breath, wheezing  Cardiovascular: Negative for chest pain, palpitations and leg swelling.  Gastrointestinal: Negative for heartburn, abdominal pain, vomiting, diarrhea or consitpation Genitourinary: Negative for dysuria, urgency, frequency, hematuria Musculoskeletal: Negative for back pain or joint pain Neurological: Negative for dizziness, seizures, syncope, focal weakness,  numbness and headaches.  + Double vision Reports no numbness of face Hematological: Does not bruise/bleed easily.  Psychiatric/Behavioral: Negative for hallucinations, confusion, dysphoric mood    Tolerating Diet: yes      DRUG ALLERGIES:  No Known Allergies  VITALS:  Blood pressure 120/79, pulse 66, temperature 98.2 F (36.8 C), temperature source Oral, resp. rate 16, height 6' (1.829 m), weight 86.2 kg, SpO2 95 %.  PHYSICAL EXAMINATION:  Constitutional: Appears well-developed and well-nourished. No distress. HENT: Normocephalic. Marland Kitchen Oropharynx is clear and moist.  Eyes: Conjunctivae and EOM are normal. PERRLA, no scleral icterus.  Neck: Normal ROM. Neck supple. No JVD. No tracheal deviation. CVS: RRR, S1/S2 +, no murmurs, no gallops, no carotid bruit.  Pulmonary: Effort and breath sounds normal, no stridor, rhonchi, wheezes, rales.  Abdominal: Soft. BS +,  no distension, tenderness, rebound or guarding.  Musculoskeletal: Normal range of motion. No edema and no tenderness.  Neuro: Alert. CN 2-12 grossly intact.   Reports double vision left eye Skin: Skin is warm and dry. No rash noted. Psychiatric: Normal mood and affect.      LABORATORY PANEL:   CBC Recent Labs  Lab 11/11/18 1633  WBC 5.6  HGB 13.5  HCT 40.2  PLT 154   ------------------------------------------------------------------------------------------------------------------  Chemistries  Recent Labs  Lab 11/11/18 1633  NA 139  K 4.0  CL 108  CO2 23  GLUCOSE 96  BUN 28*  CREATININE 1.01  CALCIUM 8.9  AST 30  ALT 20  ALKPHOS 67  BILITOT 0.7   ------------------------------------------------------------------------------------------------------------------  Cardiac Enzymes No results for input(s): TROPONINI in the last 168 hours. ------------------------------------------------------------------------------------------------------------------  RADIOLOGY:  Ct Angio Head W Or Wo Contrast  Result Date: 11/11/2018 CLINICAL DATA:  Focal neuro deficit for greater than 6 hours. Diplopia. Slurred speech. Difficulty walking. Right facial droop. EXAM: CT ANGIOGRAPHY HEAD AND NECK TECHNIQUE: Multidetector CT imaging of the head and neck was performed using the standard protocol during bolus administration of intravenous contrast. Multiplanar CT image reconstructions and MIPs were obtained to evaluate the vascular anatomy. Carotid stenosis measurements (when applicable) are obtained utilizing NASCET criteria, using the distal internal carotid diameter as the denominator. CONTRAST:  24mL OMNIPAQUE IOHEXOL 350 MG/ML SOLN COMPARISON:  None. FINDINGS: CTA NECK FINDINGS Aortic arch: A 3 vessel arch configuration is present. Minimal atherosclerotic calcifications are present in the aortic arch. There is some tortuosity of the arch without aneurysm. Minimal calcification is present in the origins of the innominate and left common carotid artery without significant stenosis. Right carotid system: The right common carotid artery is mildly  tortuous. Minimal calcifications are present at the carotid bifurcation and proximal right ICA without significant stenosis. There is mild tortuosity of the cervical right ICA without significant stenosis. Left  carotid system: The left common carotid artery is within normal limits. Atherosclerotic calcifications are present at the left carotid bifurcation and proximal left ICA without a significant stenosis relative to the more distal vessel. Mild tortuosity is present. Vertebral arteries: The left vertebral artery is the dominant vessel. Both vertebral arteries originate from the subclavian arteries. Atherosclerotic calcifications are present at the origin of the left vertebral artery without a significant stenosis relative to the more distal vessel. There is no significant stenosis of either vertebral artery in the neck. Skeleton: Slight anterolisthesis at C4-5 is degenerative. Chronic loss of disc height is present at C5-6 and C6-7. Degenerative changes extend into the thoracic spine. Leftward curvature is present at the cervicothoracic junction. Rightward curvature is centered at T5-6 Other neck: The soft tissues the neck are otherwise unremarkable. Upper chest: Mild ground-glass attenuation likely reflects atelectasis. Review of the MIP images confirms the above findings CTA HEAD FINDINGS Anterior circulation: The internal carotid arteries are within normal limits from the skull base through the ICA termini. The A1 and M1 segments are normal. Anterior communicating artery is patent. MCA bifurcations are intact. The ACA and MCA branch vessels are within normal limits. Posterior circulation: The vertebral arteries are codominant. PICA origins are visualized and normal. The vertebrobasilar junction is normal. Basilar artery is mildly ectatic. Both posterior cerebral arteries originate from the basilar tip. The PCA branch vessels are within normal limits. Venous sinuses: The dural sinuses are patent. Straight sinus  deep cerebral veins are intact. Cortical veins are unremarkable. There is no significant vascular malformation. Anatomic variants: None Review of the MIP images confirms the above findings IMPRESSION: 1. No emergent large vessel occlusion. 2. Tortuosity of the cervical vasculature without significant stenosis. This is nonspecific, but commonly seen in the setting of chronic hypertension. 3. Degenerative changes of the cervical spine. Electronically Signed   By: San Morelle M.D.   On: 11/11/2018 20:02   Ct Angio Neck W Or Wo Contrast  Result Date: 11/11/2018 CLINICAL DATA:  Focal neuro deficit for greater than 6 hours. Diplopia. Slurred speech. Difficulty walking. Right facial droop. EXAM: CT ANGIOGRAPHY HEAD AND NECK TECHNIQUE: Multidetector CT imaging of the head and neck was performed using the standard protocol during bolus administration of intravenous contrast. Multiplanar CT image reconstructions and MIPs were obtained to evaluate the vascular anatomy. Carotid stenosis measurements (when applicable) are obtained utilizing NASCET criteria, using the distal internal carotid diameter as the denominator. CONTRAST:  5mL OMNIPAQUE IOHEXOL 350 MG/ML SOLN COMPARISON:  None. FINDINGS: CTA NECK FINDINGS Aortic arch: A 3 vessel arch configuration is present. Minimal atherosclerotic calcifications are present in the aortic arch. There is some tortuosity of the arch without aneurysm. Minimal calcification is present in the origins of the innominate and left common carotid artery without significant stenosis. Right carotid system: The right common carotid artery is mildly tortuous. Minimal calcifications are present at the carotid bifurcation and proximal right ICA without significant stenosis. There is mild tortuosity of the cervical right ICA without significant stenosis. Left carotid system: The left common carotid artery is within normal limits. Atherosclerotic calcifications are present at the left carotid  bifurcation and proximal left ICA without a significant stenosis relative to the more distal vessel. Mild tortuosity is present. Vertebral arteries: The left vertebral artery is the dominant vessel. Both vertebral arteries originate from the subclavian arteries. Atherosclerotic calcifications are present at the origin of the left vertebral artery without a significant stenosis relative to the more distal vessel. There is  no significant stenosis of either vertebral artery in the neck. Skeleton: Slight anterolisthesis at C4-5 is degenerative. Chronic loss of disc height is present at C5-6 and C6-7. Degenerative changes extend into the thoracic spine. Leftward curvature is present at the cervicothoracic junction. Rightward curvature is centered at T5-6 Other neck: The soft tissues the neck are otherwise unremarkable. Upper chest: Mild ground-glass attenuation likely reflects atelectasis. Review of the MIP images confirms the above findings CTA HEAD FINDINGS Anterior circulation: The internal carotid arteries are within normal limits from the skull base through the ICA termini. The A1 and M1 segments are normal. Anterior communicating artery is patent. MCA bifurcations are intact. The ACA and MCA branch vessels are within normal limits. Posterior circulation: The vertebral arteries are codominant. PICA origins are visualized and normal. The vertebrobasilar junction is normal. Basilar artery is mildly ectatic. Both posterior cerebral arteries originate from the basilar tip. The PCA branch vessels are within normal limits. Venous sinuses: The dural sinuses are patent. Straight sinus deep cerebral veins are intact. Cortical veins are unremarkable. There is no significant vascular malformation. Anatomic variants: None Review of the MIP images confirms the above findings IMPRESSION: 1. No emergent large vessel occlusion. 2. Tortuosity of the cervical vasculature without significant stenosis. This is nonspecific, but commonly  seen in the setting of chronic hypertension. 3. Degenerative changes of the cervical spine. Electronically Signed   By: San Morelle M.D.   On: 11/11/2018 20:02   Mr Brain Wo Contrast  Result Date: 11/11/2018 CLINICAL DATA:  Slurred speech. Difficulty walking. Right facial droop. EXAM: MRI HEAD WITHOUT CONTRAST TECHNIQUE: Multiplanar, multiecho pulse sequences of the brain and surrounding structures were obtained without intravenous contrast. COMPARISON:  CTA head and neck and CT head without contrast 11/11/2018 FINDINGS: Brain: The diffusion-weighted images demonstrate acute nonhemorrhagic infarct involving the right anterior thalamus measuring up to 8 mm. Linear 6 mm infarct is present in the posterior left temporal lobe on image 25 series 5. A 10 mm left occipital cortical infarct is present. A punctate subcortical right occipital infarct is present. A 3 mm acute nonhemorrhagic white matter infarct is present in the right parietal lobe. Confluent periventricular and subcortical T2 hyperintensities are present bilaterally. There are remote lacunar infarcts involving the basal ganglia bilaterally. White matter changes extend into the brainstem. Remote lacunar infarcts are present in the cerebellum bilaterally. Vascular: Flow is present in the major intracranial arteries. Skull and upper cervical spine: The craniocervical junction is normal. Upper cervical spine is within normal limits. Marrow signal is unremarkable. Sinuses/Orbits: Fluid is present in the right sphenoid sinus. Mucosal disease is present in the maxillary sinuses and anterior ethmoid air cells bilaterally. The mastoid air cells are clear. The globes and orbits are within normal limits. IMPRESSION: 1. 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 2. 8 mm acute nonhemorrhagic infarct in the anterior right thalamus may involve the internal capsule 3. Acute infarct in the left  occipital lobe. 4. Small acute infarcts in the posterior left temporal lobe, right occipital lobe, and high right parietal white matter. 5. 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 6. Sinus disease as described. Electronically Signed   By: San Morelle M.D.   On: 11/11/2018 21:39   Ct Head Code Stroke Wo Contrast  Result Date: 11/11/2018 CLINICAL DATA:  Code stroke. New onset of diplopia in slurred speech. Right facial droop. Last known  well 3 hours ago. EXAM: CT HEAD WITHOUT CONTRAST TECHNIQUE: Contiguous axial images were obtained from the base of the skull through the vertex without intravenous contrast. COMPARISON:  CT head without contrast 05/17/2017. FINDINGS: Brain: Moderate diffuse white matter changes are stable. No acute cortical infarct is present. Basal ganglia are unchanged. No acute hemorrhage or mass lesion is present. No significant extraaxial fluid collection is present. The brainstem and cerebellum are within normal limits. Vascular: Moderate ectasia is present throughout the circle-of-Willis. Atherosclerotic calcifications are present within the cavernous internal carotid arteries and at the dural margin of the vertebral arteries. There is no asymmetric hyperdense vessel. Skull: Calvarium is intact. No focal lytic or blastic lesions are present. 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 Is chronic wall thickening. Bilateral anterior ethmoid mucosal thickening is present. This extends into the inferior left frontal sinus. There is fluid in the right sphenoid sinus. Mastoid air cells are clear. The globes and orbits are within normal limits. ASPECTS [facility] Stroke Program Early CT Score) - Ganglionic level infarction (caudate, lentiform nuclei, internal capsule, insula, M1-M3 cortex): 7/7 - Supraganglionic infarction (M4-M6 cortex): 3/3 Total score (0-10 with  10 being normal): 10/10 IMPRESSION: 1. No acute intracranial abnormality or significant interval change. 2. Stable moderate diffuse white matter disease. This likely reflects the sequela of chronic microvascular ischemia. 3. Intracranial atherosclerosis moderate ectasia throughout the circle-of-Willis. 4. ASPECTS is 10/10 These results were called by telephone at the time of interpretation on 11/11/2018 at 4:17 pm to [facility] , who verbally acknowledged these results. Electronically Signed   By: San Morelle M.D.   On: 11/11/2018 16:19     ASSESSMENT AND PLAN:   73 year old male with vascular dementia and hypothyroidism who presented to ER with double vision and left facial droop.  1.  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 A1c 5.5 LDL 89 Echocardiogram pending Continue aspirin and Plavix for 3 weeks and change Plavix 75 mg as monotherapy after that time. Diet as per speech PT and OT follow-up Continue telemetry Appreciate neurology consultation Add statin   2.  Hypothyroid continue Synthroid  3.  Dementia: Continue galantamine       Management plans discussed with the patient and he is in agreement.  CODE STATUS: DNR  TOTAL TIME TAKING CARE OF THIS PATIENT: 30 minutes.     POSSIBLE D/C tomorrow, DEPENDING ON CLINICAL CONDITION.   Bettey Costa M.D on 11/12/2018 at 10:33 AM  Between 7am to 6pm - Pager - [phone] After 6pm go to [email or web address] - password [email or web address]  Office  [phone]  CC: Primary care physician; Rusty Aus, MD  Note: This dictation was prepared with Dragon dictation along with smaller phrase technology. Any transcriptional errors that result from this process are unintentional.

## 2018-11-12 NOTE — Evaluation (Signed)
Occupational Therapy Evaluation Patient Details Name: Ronald Adkins MRN: [id number] DOB: Feb 16, 1946 Today's Date: 11/12/2018    History of Present Illness Pt is a 73 y.o. male presenting to hospital 11/11/18 with double vision, dysarthria, and L facial droop.  MRI brain showing multi-focal acute nonhemorrhagic infarcts in various vascular distribution suggesting a central embolic source (8 mm acute nonhemorrhagic infarct anterior R thalamus-may involve internal capsule; acute infarct L occipital lobe; small infarcts acute posterior L temporal lobe, and R occipital lobe, and high R parietal white matter.  PMH includes prostate CA, CVA, and vascular dementia.   Clinical Impression   Pt seen for OT evaluation this date. Pt lives in a 2 story home with 5 steps to enter with R/L handrails with bed/bath access on main floor with walk-in shower with built in bench and grab bar. Pt lives with his spouse and dog and is "semi-retired" working part time in [employer]. Pt reports (and spouse affirms) he was independent with mobility, ADL, and IADL prior to admission with no falls reported in past 12 months. Pt currently presents with diplopia (side by side) which disappears with 1 eye closed (pt reports closing R eye works better). Pt also demonstrates slight impairments in cognition (slightly impulsive, decr safety awareness, decr processing, cues for safety and multi-step commands) and higher level balance challenges. These impairments challenge pt's safety and ability to perform at baseline independence requiring supervision for safety. Pt currently unsafe to return to driving and pt/spouse instructed to speak with MD regarding return to driving when appropriate. Pt/spouse educated in visual compensatory strategies for diplopia and visual occlusion therapy which can be done on an outpatient basis. Pt/spouse verbalized understanding. Pt at supervision level for functional mobility and ADL during assessment,  strength and ROM WNL, and intact sensation and coordination. Recommend OP OT services upon discharge. RNCM notified.    Follow Up Recommendations  Outpatient OT;Supervision - Intermittent    Equipment Recommendations  None recommended by OT    Recommendations for Other Services       Precautions / Restrictions Precautions Precautions: Fall Restrictions Weight Bearing Restrictions: No      Mobility Bed Mobility Overal bed mobility: Modified Independent             General bed mobility comments: Semi-supine to/from sit without any noted difficulties  Transfers Overall transfer level: Needs assistance Equipment used: None Transfers: Sit to/from Stand Sit to Stand: Supervision Stand pivot transfers: Min guard       General transfer comment: CGA for safety; overall steady with transfers    Balance Overall balance assessment: Mild deficits observed, not formally tested Sitting-balance support: No upper extremity supported;Feet supported Sitting balance-Leahy Scale: Normal Sitting balance - Comments: steady sitting reaching outside BOS   Standing balance support: No upper extremity supported Standing balance-Leahy Scale: Good Standing balance comment: steady standing urinating at toilet (pt's wife present with CGA for safety) and washing hands at sink               High Level Balance Comments: overall stead with ambulating and head turns R/L in hallway           ADL either performed or assessed with clinical judgement   ADL                                         General ADL Comments: supervision level for ADL tasks  for safety     Vision Baseline Vision/History: Wears glasses Wears Glasses: Reading only;At all times(wears contacts and then uses reading glasses as needed) Patient Visual Report: Diplopia Vision Assessment?: Yes Eye Alignment: Within Functional Limits Ocular Range of Motion: Within Functional Limits Alignment/Gaze  Preference: Within Defined Limits Tracking/Visual Pursuits: Able to track stimulus in all quads without difficulty Convergence: Within functional limits Visual Fields: No apparent deficits Diplopia Assessment: Disappears with one eye closed;Objects split side to side     Perception     Praxis      Pertinent Vitals/Pain Pain Assessment: No/denies pain     Hand Dominance Right   Extremity/Trunk Assessment Upper Extremity Assessment Upper Extremity Assessment: Overall WFL for tasks assessed(no focal weakness, sensory, or coordination deficits noted)   Lower Extremity Assessment Lower Extremity Assessment: Overall WFL for tasks assessed(no focal weakness, sensory, or coordination deficits noted)   Cervical / Trunk Assessment Cervical / Trunk Assessment: Normal   Communication Communication Communication: No difficulties   Cognition Arousal/Alertness: Awake/alert Behavior During Therapy: WFL for tasks assessed/performed Overall Cognitive Status: Impaired/Different from baseline Area of Impairment: Attention;Memory;Safety/judgement;Following commands;Awareness;Problem solving                   Current Attention Level: Selective Memory: Decreased short-term memory Following Commands: Follows multi-step commands inconsistently Safety/Judgement: Decreased awareness of deficits;Decreased awareness of safety Awareness: Anticipatory Problem Solving: Requires verbal cues;Slow processing General Comments: pt tends to joke and requires cues to redirect/"be serious"   General Comments       Exercises Other Exercises Other Exercises: Pt/spouse briefly educated in visual occlusion therapy and visual compensatory strategies for managing diplopia   Shoulder Instructions      Home Living Family/patient expects to be discharged to:: Private residence Living Arrangements: Spouse/significant other Available Help at Discharge: Family Type of Home: House Home Access: Stairs to  enter Technical brewer of Steps: 5 Entrance Stairs-Rails: Right;Left Home Layout: Two level;Able to live on main level with bedroom/bathroom Alternate Level Stairs-Number of Steps: step up to kitchen   Bathroom Shower/Tub: Occupational psychologist: Handicapped height     Home Equipment: Shower seat - built in;Grab bars - tub/shower          Prior Functioning/Environment Level of Independence: Independent        Comments: Pt indep with mobility and ADL, driving, and working part time (semi-retired) in [employer]        OT Problem List: Decreased cognition;Impaired vision/perception;Decreased safety awareness      OT Treatment/Interventions: Self-care/ADL training;Therapeutic exercise;Therapeutic activities;Cognitive remediation/compensation;Neuromuscular education;Visual/perceptual remediation/compensation;DME and/or AE instruction;Patient/family education    OT Goals(Current goals can be found in the care plan section) Acute Rehab OT Goals Patient Stated Goal: to go home OT Goal Formulation: With patient/family Time For Goal Achievement: 11/26/18 Potential to Achieve Goals: Good ADL Goals Additional ADL Goal #1: Pt will perform morning ADL routine at supervision level with no verbal cues required for safety. Additional ADL Goal #2: Pt will utilize learned visual compensatory strategies during 5/5 ADL tasks to minimize risk of falls with occasional verbal cues as needed.  OT Frequency: Min 1X/week   Barriers to D/C:            Co-evaluation              AM-PAC OT "6 Clicks" Daily Activity     Outcome Measure Help from another person eating meals?: None Help from another person taking care of personal grooming?: None Help from another person toileting,  which includes using toliet, bedpan, or urinal?: A Little Help from another person bathing (including washing, rinsing, drying)?: A Little Help from another person to put on and taking off regular  upper body clothing?: None Help from another person to put on and taking off regular lower body clothing?: A Little 6 Click Score: 21   End of Session    Activity Tolerance: Patient tolerated treatment well Patient left: in bed;with call bell/phone within reach;with bed alarm set;with family/visitor present  OT Visit Diagnosis: Other abnormalities of gait and mobility (R26.89);Low vision, both eyes (H54.2);Other symptoms and signs involving cognitive function                Time: [id number] OT Time Calculation (min): 17 min Charges:  OT General Charges $OT Visit: 1 Visit OT Evaluation $OT Eval Low Complexity: 1 Low  Jeni Salles, MPH, MS, OTR/L ascom [phone] 11/12/18, 1:19 PM

## 2018-11-12 NOTE — Progress Notes (Signed)
*  PRELIMINARY RESULTS* Echocardiogram 2D Echocardiogram has been performed. Bubble Study (Saline Microcavitation) requested & performed on this study.  Ronald Adkins 11/12/2018, 9:24 AM

## 2018-11-13 NOTE — Progress Notes (Signed)
Occupational Therapy Treatment Patient Details Name: Ronald Adkins MRN: [id number] DOB: 04-28-1945 Today's Date: 11/13/2018    History of present illness Pt is a 73 y.o. male presenting to hospital 11/11/18 with double vision, dysarthria, and L facial droop.  MRI brain showing multi-focal acute nonhemorrhagic infarcts in various vascular distribution suggesting a central embolic source (8 mm acute nonhemorrhagic infarct anterior R thalamus-may involve internal capsule; acute infarct L occipital lobe; small infarcts acute posterior L temporal lobe, and R occipital lobe, and high R parietal white matter.  PMH includes prostate CA, CVA, and vascular dementia.   OT comments  Pt seen for OT tx this date. Pt lying in bed, with HOB approx 25 degrees, pt reports having eaten like this for breakfast. Pt educated in safety and proper upright positioning to minimize risk of aspiration. Pt verbalized understanding. Pt ambulated to the bathroom with SBA for safety. Pt stood at toilet to urinate then washed his hands with PRN verbal cues for safety/sequencing/locating the soap on the wall. Pt continues to demonstrate poor insight into deficits and safety awareness. Pt reports diplopia improving slightly, reports worse with up close and the clock but no difficulty reading the board or seeing the television. Will continue to progress towards OT goals. Continue to recommend intermittent supervision and OP OT for diplopia and cognitive skills.    Follow Up Recommendations  Outpatient OT;Supervision - Intermittent    Equipment Recommendations  None recommended by OT    Recommendations for Other Services      Precautions / Restrictions Precautions Precautions: Fall Restrictions Weight Bearing Restrictions: No       Mobility Bed Mobility Overal bed mobility: Modified Independent                Transfers Overall transfer level: Needs assistance Equipment used: None Transfers: Sit to/from  Stand Sit to Stand: Supervision              Balance Overall balance assessment: Mild deficits observed, not formally tested                                         ADL either performed or assessed with clinical judgement   ADL Overall ADL's : Needs assistance/impaired                                       General ADL Comments: Pt ambulated and stood in front of toilet to urinate with close supervision/SBA 2/2 decr safety awareness and mild balance deficits, occasional cues for safety     Vision Baseline Vision/History: Wears glasses Wears Glasses: Reading only;At all times(wears contacts and then uses reading glasses as needed) Patient Visual Report: Diplopia(pt reports improving slightly, reports worse with up close and the clock but no difficulty reading the board or seeing the television)     Perception     Praxis      Cognition Arousal/Alertness: Awake/alert Behavior During Therapy: [facility] for tasks assessed/performed Overall Cognitive Status: Impaired/Different from baseline Area of Impairment: Attention;Memory;Safety/judgement;Following commands;Awareness;Problem solving                   Current Attention Level: Selective Memory: Decreased short-term memory Following Commands: Follows multi-step commands inconsistently Safety/Judgement: Decreased awareness of deficits;Decreased awareness of safety Awareness: Anticipatory Problem Solving: Requires verbal cues;Slow processing General Comments:  pt tends to joke during session and reports he went to the bar last night        Exercises     Shoulder Instructions       General Comments      Pertinent Vitals/ Pain       Pain Assessment: No/denies pain  Home Living                                          Prior Functioning/Environment              Frequency  Min 1X/week        Progress Toward Goals  OT Goals(current goals can now be  found in the care plan section)  Progress towards OT goals: Progressing toward goals  Acute Rehab OT Goals Patient Stated Goal: to go home OT Goal Formulation: With patient/family Time For Goal Achievement: 11/26/18 Potential to Achieve Goals: Good  Plan Discharge plan remains appropriate;Frequency remains appropriate    Co-evaluation                 AM-PAC OT "6 Clicks" Daily Activity     Outcome Measure   Help from another person eating meals?: None Help from another person taking care of personal grooming?: None Help from another person toileting, which includes using toliet, bedpan, or urinal?: A Little Help from another person bathing (including washing, rinsing, drying)?: A Little Help from another person to put on and taking off regular upper body clothing?: None Help from another person to put on and taking off regular lower body clothing?: A Little 6 Click Score: 21    End of Session    OT Visit Diagnosis: Other abnormalities of gait and mobility (R26.89);Low vision, both eyes (H54.2);Other symptoms and signs involving cognitive function   Activity Tolerance Patient tolerated treatment well   Patient Left in bed;with call bell/phone within reach;with bed alarm set   Nurse Communication          Time: [phone] OT Time Calculation (min): 9 min  Charges: OT General Charges $OT Visit: 1 Visit OT Treatments $Self Care/Home Management : 8-22 mins  Jeni Salles, MPH, MS, OTR/L ascom [phone] 11/13/18, 10:16 AM

## 2018-11-13 NOTE — Plan of Care (Signed)
Reviewed consent form for TEE tomorrow with patient.  Explained they will sedate him so he will not feel pain.  Encouraged him to eat a good supper tonight because he will be NPO after MN.

## 2018-11-13 NOTE — Evaluation (Signed)
Speech Language Pathology Evaluation Patient Details Name: MEYSON ROYE MRN: [id number] DOB: 1945/11/22 Today's Date: 11/13/2018 Time: 1515-1600 SLP Time Calculation (min) (ACUTE ONLY): 45 min  Problem List:  Patient Active Problem List   Diagnosis Date Noted  . Acute CVA (cerebrovascular accident) ([place]) 11/11/2018   Past Medical History:  Past Medical History:  Diagnosis Date  . Dementia ([place])   . Thyroid disease    Past Surgical History: History reviewed. No pertinent surgical history. HPI:  Pt is a 73 y.o. male with past medical history of prostate cancer, CVA, vascular dementia, and hypothyroidism presenting to the ED with complaints of double vision, dysarthria, and left facial droop. MRI revealed: 8 mm acute nonhemorrhagic infarct in the anterior right thalamus may involve the internal capsule; Acute infarct in the left occipital lobe; Small acute infarcts in the posterior left temporal lobe, right occipital lobe, and high right parietal white matter; Diffuse white matter disease and remote lacunar infarcts. Patient's wife noticed that he was slurring his speech, with left facial droop and difficulty walking. Patient apparently complained of blurred vision and just not feeling "right". Pt attempted to drive home but drove irratically so they came to the ED. Pt is tolerting current diet w/ recommended (general) aspiration precautions - he needs cues for follow through w/ tasks(OT/PT notes).    Assessment / Plan / Recommendation Clinical Impression  Pt appears to present w/ Mild Motor Speech and (baseline?) Cognitive-linguistic deficits; Mild Dysarthria present moreso in connected speech/conversation. Pt was instructed on using strategies of slowing down, overarticulating, and increasing volume which appeared to improve his articulation of speech. Full intelligibility noted. OM exam revealed a slight L facial/labial tone decrease; lingual strength/ROM appropriate in individual  movements but noted min slower in rapid movements. It was difficult to determine if any change/decline in pt's Cognitive-linguistic status from his baseline - pt has baseline Vascular Dementia and no family was present to give his baseline. Noted reduced Awareness and Insight into his deficits and overall situation; he often referred to needing to get home "'cause I'm fine, and I've got my dog at home w/ me". He was able to follow along w/ basic communication-language engagement tasks but as the tasks increased in complexity, his accuracy and follow through reduced. Verbal cues increased follow through accuracy. Pt demonstrated decreased awareness for need for general aspiration precautions; safety awareness in various tasks. Recommend f/u w/ skilled ST services at home post discharge for formal assessment of Cognitive-linguistic abilites to assess for any change/decline in status from his baseline; more formal assessment of pt's Insight and Safety Awareness as he was Driving prior to admission. Pt's Diplopia will impact reading/writing tasks/assessment at this time. Recommend general aspiration precautions and NO straw use; Pills in Puree (whole) for safer swallowing as needed. Recommend monitoring during meals for setup/positioning and need for cues for follow through w/ precautions (d/t baseline impact from Vascular Dementia).     SLP Assessment  SLP Recommendation/Assessment: All further Speech Lanaguage Pathology  needs can be addressed in the next venue of care SLP Visit Diagnosis: Dysarthria and anarthria (R47.1);Cognitive communication deficit (R41.841)(possible impact from baseline Vascular Dementia; f/u)    Follow Up Recommendations  Home health SLP(TBD)    Frequency and Duration (n/a)  (n/a)      SLP Evaluation Cognition  Overall Cognitive Status: History of cognitive impairments - at baseline Arousal/Alertness: Awake/alert Orientation Level: Oriented to person;Oriented to place(not  oriented to situation - reduced insight overall) Attention: Focused;Sustained Focused Attention: Appears intact  Sustained Attention: Impaired(could be baseline?) Sustained Attention Impairment: Verbal complex;Functional complex Memory: Impaired Memory Impairment: (Vascular Dementia baseline) Awareness: Impaired Awareness Impairment: Anticipatory impairment Problem Solving: Impaired Problem Solving Impairment: Verbal complex;Functional complex Executive Function: Decision Making;Sequencing;Organizing(Insight impaired) Behaviors: Restless(min) Safety/Judgment: Impaired Comments: could be baseline       Comprehension  Auditory Comprehension Overall Auditory Comprehension: Appears within functional limits for tasks assessed(basic, functional tasks assessed) Yes/No Questions: Within Functional Limits(basic) Commands: Within Functional Limits(1-2 step) Conversation: Simple Other Conversation Comments: easily distracted Interfering Components: Attention EffectiveTechniques: Repetition Visual Recognition/Discrimination Discrimination: Not tested Reading Comprehension Reading Status: Not tested(d/t Vision impairments sec. to CVA)    Expression Expression Primary Mode of Expression: Verbal Verbal Expression Overall Verbal Expression: Appears within functional limits for tasks assessed(for general conversation, responses) Initiation: No impairment Automatic Speech: Name;Social Response;Counting;Day of week Level of Generative/Spontaneous Verbalization: Sentence Repetition: No impairment Naming: No impairment Interfering Components: Attention;Speech intelligibility(slight dysarthria) Non-Verbal Means of Communication: Not applicable Written Expression Dominant Hand: Right Written Expression: Not tested(d/t Vision deficits)   Oral / Motor  Oral Motor/Sensory Function Overall Oral Motor/Sensory Function: Mild impairment(slight) Facial ROM: Reduced left Facial Symmetry: Abnormal  symmetry left Facial Strength: Reduced left Facial Sensation: Within Functional Limits Lingual ROM: Within Functional Limits Lingual Symmetry: Within Functional Limits Lingual Strength: Within Functional Limits Lingual Sensation: Within Functional Limits Velum: Within Functional Limits Mandible: Within Functional Limits Motor Speech Overall Motor Speech: Impaired(Mild) Respiration: Within functional limits Phonation: Normal Resonance: Within functional limits Articulation: Impaired(slight-Mild) Level of Impairment: Sentence Intelligibility: Intelligible Motor Planning: Impaired Level of Impairment: Sentence Motor Speech Errors: Aware(semi) Effective Techniques: Slow rate;Increased vocal intensity;Over-articulate   GO                       Orinda Kenner, MS, CCC-SLP Watson,Katherine 11/13/2018, 4:36 PM

## 2018-11-13 NOTE — Progress Notes (Signed)
Subjective: Patient continues to complain of diplopia, particularly with objects at a distance.  Awake and alert.    Objective: Current vital signs: BP 130/77 (BP Location: Left Arm)   Pulse (!) 49   Temp 97.8 F (36.6 C) (Oral)   Resp 16   Ht 6' (1.829 m)   Wt 86.2 kg   SpO2 97%   BMI 25.77 kg/m  Vital signs in last 24 hours: Temp:  [97.5 F (36.4 C)-98.4 F (36.9 C)] 97.8 F (36.6 C) (09/08 0502) Pulse Rate:  [49-74] 49 (09/08 0502) Resp:  [14-20] 16 (09/07 2311) BP: (125-141)/(77-97) 130/77 (09/08 0502) SpO2:  [95 %-97 %] 97 % (09/08 0502)  Intake/Output from previous day: 09/07 0701 - 09/08 0700 In: 2719.2 [P.O.:240; I.V.:2479.2] Out: -  Intake/Output this shift: No intake/output data recorded. Nutritional status:  Diet Order            Diet Heart Room service appropriate? Yes; Fluid consistency: Thin  Diet effective now              Neurologic Exam: Mental Status: Alert.  Speech fluent without evidence of aphasia.  Able to follow 3 step commands without difficulty. Cranial Nerves: II: Visual fields grossly normal, pupils equal, round, reactive to light and accommodation III,IV, VI: ptosis not present, extra-ocular motions intact V,VII: decrease in left NLF, facial light touch sensation normal bilaterally VIII: hearing normal bilaterally IX,X: gag reflex present XI: bilateral shoulder shrug XII: midline tongue extension Motor: 5/5 throughout Sensory: Pinprick and light touch intact throughout, bilaterally   Lab Results: Basic Metabolic Panel: Recent Labs  Lab 11/11/18 1633  NA 139  K 4.0  CL 108  CO2 23  GLUCOSE 96  BUN 28*  CREATININE 1.01  CALCIUM 8.9    Liver Function Tests: Recent Labs  Lab 11/11/18 1633  AST 30  ALT 20  ALKPHOS 67  BILITOT 0.7  PROT 6.4*  ALBUMIN 3.9   No results for input(s): LIPASE, AMYLASE in the last 168 hours. No results for input(s): AMMONIA in the last 168 hours.  CBC: Recent Labs  Lab  11/11/18 1633  WBC 5.6  NEUTROABS 3.2  HGB 13.5  HCT 40.2  MCV 95.3  PLT 154    Cardiac Enzymes: No results for input(s): CKTOTAL, CKMB, CKMBINDEX, TROPONINI in the last 168 hours.  Lipid Panel: Recent Labs  Lab 11/11/18 1633  CHOL 149  TRIG 51  HDL 50  CHOLHDL 3.0  VLDL 10  LDLCALC 89    CBG: Recent Labs  Lab 11/11/18 Nucla    Microbiology: Results for orders placed or performed during the hospital encounter of 11/11/18  SARS Coronavirus 2 [facility] order, Performed in [place] hospital lab)     Status: None   Collection Time: 11/11/18  5:06 PM  Result Value Ref Range Status   SARS Coronavirus 2 NEGATIVE NEGATIVE Final    Comment: (NOTE) If result is NEGATIVE SARS-CoV-2 target nucleic acids are NOT DETECTED. The SARS-CoV-2 RNA is generally detectable in upper and lower  respiratory specimens during the acute phase of infection. The lowest  concentration of SARS-CoV-2 viral copies this assay can detect is 250  copies / mL. A negative result does not preclude SARS-CoV-2 infection  and should not be used as the sole basis for treatment or other  patient management decisions.  A negative result may occur with  improper specimen collection / handling, submission of specimen other  than nasopharyngeal swab, presence of viral mutation(s) within  the  areas targeted by this assay, and inadequate number of viral copies  (<250 copies / mL). A negative result must be combined with clinical  observations, patient history, and epidemiological information. If result is POSITIVE SARS-CoV-2 target nucleic acids are DETECTED. The SARS-CoV-2 RNA is generally detectable in upper and lower  respiratory specimens dur ing the acute phase of infection.  Positive  results are indicative of active infection with SARS-CoV-2.  Clinical  correlation with patient history and other diagnostic information is  necessary to determine patient infection status.  Positive results  do  not rule out bacterial infection or co-infection with other viruses. If result is PRESUMPTIVE POSTIVE SARS-CoV-2 nucleic acids MAY BE PRESENT.   A presumptive positive result was obtained on the submitted specimen  and confirmed on repeat testing.  While 2019 novel coronavirus  (SARS-CoV-2) nucleic acids may be present in the submitted sample  additional confirmatory testing may be necessary for epidemiological  and / or clinical management purposes  to differentiate between  SARS-CoV-2 and other Sarbecovirus currently known to infect humans.  If clinically indicated additional testing with an alternate test  methodology [phone]) is advised. The SARS-CoV-2 RNA is generally  detectable in upper and lower respiratory sp ecimens during the acute  phase of infection. The expected result is Negative. Fact Sheet for Patients:  StrictlyIdeas.no Fact Sheet for Healthcare Providers: BankingDealers.co.za This test is not yet approved or cleared by the [place] FDA and has been authorized for detection and/or diagnosis of SARS-CoV-2 by FDA under an Emergency Use Authorization (EUA).  This EUA will remain in effect (meaning this test can be used) for the duration of the COVID-19 declaration under Section 564(b)(1) of the Act, 21 U.S.C. section 360bbb-3(b)(1), unless the authorization is terminated or revoked sooner. Performed at [facility], [address] [place], [place] [address]     Coagulation Studies: Recent Labs    11/11/18 1633  LABPROT 12.5  INR 0.9    Imaging: Ct Angio Head W Or Wo Contrast  Result Date: 11/11/2018 CLINICAL DATA:  Focal neuro deficit for greater than 6 hours. Diplopia. Slurred speech. Difficulty walking. Right facial droop. EXAM: CT ANGIOGRAPHY HEAD AND NECK TECHNIQUE: Multidetector CT imaging of the head and neck was performed using the standard protocol during bolus administration of  intravenous contrast. Multiplanar CT image reconstructions and MIPs were obtained to evaluate the vascular anatomy. Carotid stenosis measurements (when applicable) are obtained utilizing NASCET criteria, using the distal internal carotid diameter as the denominator. CONTRAST:  37mL OMNIPAQUE IOHEXOL 350 MG/ML SOLN COMPARISON:  None. FINDINGS: CTA NECK FINDINGS Aortic arch: A 3 vessel arch configuration is present. Minimal atherosclerotic calcifications are present in the aortic arch. There is some tortuosity of the arch without aneurysm. Minimal calcification is present in the origins of the innominate and left common carotid artery without significant stenosis. Right carotid system: The right common carotid artery is mildly tortuous. Minimal calcifications are present at the carotid bifurcation and proximal right ICA without significant stenosis. There is mild tortuosity of the cervical right ICA without significant stenosis. Left carotid system: The left common carotid artery is within normal limits. Atherosclerotic calcifications are present at the left carotid bifurcation and proximal left ICA without a significant stenosis relative to the more distal vessel. Mild tortuosity is present. Vertebral arteries: The left vertebral artery is the dominant vessel. Both vertebral arteries originate from the subclavian arteries. Atherosclerotic calcifications are present at the origin of the left vertebral artery without a  significant stenosis relative to the more distal vessel. There is no significant stenosis of either vertebral artery in the neck. Skeleton: Slight anterolisthesis at C4-5 is degenerative. Chronic loss of disc height is present at C5-6 and C6-7. Degenerative changes extend into the thoracic spine. Leftward curvature is present at the cervicothoracic junction. Rightward curvature is centered at T5-6 Other neck: The soft tissues the neck are otherwise unremarkable. Upper chest: Mild ground-glass attenuation  likely reflects atelectasis. Review of the MIP images confirms the above findings CTA HEAD FINDINGS Anterior circulation: The internal carotid arteries are within normal limits from the skull base through the ICA termini. The A1 and M1 segments are normal. Anterior communicating artery is patent. MCA bifurcations are intact. The ACA and MCA branch vessels are within normal limits. Posterior circulation: The vertebral arteries are codominant. PICA origins are visualized and normal. The vertebrobasilar junction is normal. Basilar artery is mildly ectatic. Both posterior cerebral arteries originate from the basilar tip. The PCA branch vessels are within normal limits. Venous sinuses: The dural sinuses are patent. Straight sinus deep cerebral veins are intact. Cortical veins are unremarkable. There is no significant vascular malformation. Anatomic variants: None Review of the MIP images confirms the above findings IMPRESSION: 1. No emergent large vessel occlusion. 2. Tortuosity of the cervical vasculature without significant stenosis. This is nonspecific, but commonly seen in the setting of chronic hypertension. 3. Degenerative changes of the cervical spine. Electronically Signed   By: San Morelle M.D.   On: 11/11/2018 20:02   Ct Angio Neck W Or Wo Contrast  Result Date: 11/11/2018 CLINICAL DATA:  Focal neuro deficit for greater than 6 hours. Diplopia. Slurred speech. Difficulty walking. Right facial droop. EXAM: CT ANGIOGRAPHY HEAD AND NECK TECHNIQUE: Multidetector CT imaging of the head and neck was performed using the standard protocol during bolus administration of intravenous contrast. Multiplanar CT image reconstructions and MIPs were obtained to evaluate the vascular anatomy. Carotid stenosis measurements (when applicable) are obtained utilizing NASCET criteria, using the distal internal carotid diameter as the denominator. CONTRAST:  63mL OMNIPAQUE IOHEXOL 350 MG/ML SOLN COMPARISON:  None. FINDINGS:  CTA NECK FINDINGS Aortic arch: A 3 vessel arch configuration is present. Minimal atherosclerotic calcifications are present in the aortic arch. There is some tortuosity of the arch without aneurysm. Minimal calcification is present in the origins of the innominate and left common carotid artery without significant stenosis. Right carotid system: The right common carotid artery is mildly tortuous. Minimal calcifications are present at the carotid bifurcation and proximal right ICA without significant stenosis. There is mild tortuosity of the cervical right ICA without significant stenosis. Left carotid system: The left common carotid artery is within normal limits. Atherosclerotic calcifications are present at the left carotid bifurcation and proximal left ICA without a significant stenosis relative to the more distal vessel. Mild tortuosity is present. Vertebral arteries: The left vertebral artery is the dominant vessel. Both vertebral arteries originate from the subclavian arteries. Atherosclerotic calcifications are present at the origin of the left vertebral artery without a significant stenosis relative to the more distal vessel. There is no significant stenosis of either vertebral artery in the neck. Skeleton: Slight anterolisthesis at C4-5 is degenerative. Chronic loss of disc height is present at C5-6 and C6-7. Degenerative changes extend into the thoracic spine. Leftward curvature is present at the cervicothoracic junction. Rightward curvature is centered at T5-6 Other neck: The soft tissues the neck are otherwise unremarkable. Upper chest: Mild ground-glass attenuation likely reflects atelectasis. Review of the  MIP images confirms the above findings CTA HEAD FINDINGS Anterior circulation: The internal carotid arteries are within normal limits from the skull base through the ICA termini. The A1 and M1 segments are normal. Anterior communicating artery is patent. MCA bifurcations are intact. The ACA and MCA  branch vessels are within normal limits. Posterior circulation: The vertebral arteries are codominant. PICA origins are visualized and normal. The vertebrobasilar junction is normal. Basilar artery is mildly ectatic. Both posterior cerebral arteries originate from the basilar tip. The PCA branch vessels are within normal limits. Venous sinuses: The dural sinuses are patent. Straight sinus deep cerebral veins are intact. Cortical veins are unremarkable. There is no significant vascular malformation. Anatomic variants: None Review of the MIP images confirms the above findings IMPRESSION: 1. No emergent large vessel occlusion. 2. Tortuosity of the cervical vasculature without significant stenosis. This is nonspecific, but commonly seen in the setting of chronic hypertension. 3. Degenerative changes of the cervical spine. Electronically Signed   By: San Morelle M.D.   On: 11/11/2018 20:02   Mr Brain Wo Contrast  Result Date: 11/11/2018 CLINICAL DATA:  Slurred speech. Difficulty walking. Right facial droop. EXAM: MRI HEAD WITHOUT CONTRAST TECHNIQUE: Multiplanar, multiecho pulse sequences of the brain and surrounding structures were obtained without intravenous contrast. COMPARISON:  CTA head and neck and CT head without contrast 11/11/2018 FINDINGS: Brain: The diffusion-weighted images demonstrate acute nonhemorrhagic infarct involving the right anterior thalamus measuring up to 8 mm. Linear 6 mm infarct is present in the posterior left temporal lobe on image 25 series 5. A 10 mm left occipital cortical infarct is present. A punctate subcortical right occipital infarct is present. A 3 mm acute nonhemorrhagic white matter infarct is present in the right parietal lobe. Confluent periventricular and subcortical T2 hyperintensities are present bilaterally. There are remote lacunar infarcts involving the basal ganglia bilaterally. White matter changes extend into the brainstem. Remote lacunar infarcts are present  in the cerebellum bilaterally. Vascular: Flow is present in the major intracranial arteries. Skull and upper cervical spine: The craniocervical junction is normal. Upper cervical spine is within normal limits. Marrow signal is unremarkable. Sinuses/Orbits: Fluid is present in the right sphenoid sinus. Mucosal disease is present in the maxillary sinuses and anterior ethmoid air cells bilaterally. The mastoid air cells are clear. The globes and orbits are within normal limits. IMPRESSION: 1. 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 2. 8 mm acute nonhemorrhagic infarct in the anterior right thalamus may involve the internal capsule 3. Acute infarct in the left occipital lobe. 4. Small acute infarcts in the posterior left temporal lobe, right occipital lobe, and high right parietal white matter. 5. 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 6. Sinus disease as described. Electronically Signed   By: San Morelle M.D.   On: 11/11/2018 21:39   Ct Head Code Stroke Wo Contrast  Result Date: 11/11/2018 CLINICAL DATA:  Code stroke. New onset of diplopia in slurred speech. Right facial droop. Last known well 3 hours ago. EXAM: CT HEAD WITHOUT CONTRAST TECHNIQUE: Contiguous axial images were obtained from the base of the skull through the vertex without intravenous contrast. COMPARISON:  CT head without contrast 05/17/2017. FINDINGS: Brain: Moderate diffuse white matter changes are stable. No acute cortical infarct is present. Basal ganglia are unchanged. No acute hemorrhage or mass lesion is present. No significant extraaxial fluid collection is present. The brainstem  and cerebellum are within normal limits. Vascular: Moderate ectasia is present throughout the circle-of-Willis. Atherosclerotic calcifications are present within the cavernous internal  carotid arteries and at the dural margin of the vertebral arteries. There is no asymmetric hyperdense vessel. Skull: Calvarium is intact. No focal lytic or blastic lesions are present. 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 Is chronic wall thickening. Bilateral anterior ethmoid mucosal thickening is present. This extends into the inferior left frontal sinus. There is fluid in the right sphenoid sinus. Mastoid air cells are clear. The globes and orbits are within normal limits. ASPECTS [facility] Stroke Program Early CT Score) - Ganglionic level infarction (caudate, lentiform nuclei, internal capsule, insula, M1-M3 cortex): 7/7 - Supraganglionic infarction (M4-M6 cortex): 3/3 Total score (0-10 with 10 being normal): 10/10 IMPRESSION: 1. No acute intracranial abnormality or significant interval change. 2. Stable moderate diffuse white matter disease. This likely reflects the sequela of chronic microvascular ischemia. 3. Intracranial atherosclerosis moderate ectasia throughout the circle-of-Willis. 4. ASPECTS is 10/10 These results were called by telephone at the time of interpretation on 11/11/2018 at 4:17 pm to [facility] , who verbally acknowledged these results. Electronically Signed   By: San Morelle M.D.   On: 11/11/2018 16:19    Medications:  I have reviewed the patient's current medications. Scheduled: .  stroke: mapping our early stages of recovery book   Does not apply Once  . aspirin EC  81 mg Oral Daily  . atorvastatin  40 mg Oral q1800  . clopidogrel  75 mg Oral Daily  . enoxaparin (LOVENOX) injection  40 mg Subcutaneous Q24H  . etodolac  400 mg Oral BID  . galantamine  16 mg Oral Daily  . levothyroxine  100 mcg Oral Daily  . multivitamin with minerals  1 tablet Oral Daily  . vitamin B-12  1,000 mcg Oral Daily    Assessment/Plan: Patient remains stable with no new neurological complaints.  MRI of the brain shows evidence of multiple, acute,  bilateral infarcts, likely embolic.  CTA unremarkable.  Echocardiogram technically difficult with EF of 60-65%. Patient on ASA, Plavix and statin.  Recommendations: 1. TEE.  If unremarkable patient to have prolonged cardiac monitoring on an outpatient basis.   2. Continue ASA, Plavix and statin 3. Telemetry 4. Continue therapy   LOS: 2 days   Alexis Goodell, MD Neurology [phone] 11/13/2018  8:45 AM

## 2018-11-13 NOTE — TOC Initial Note (Signed)
Transition of Care [facility]) - Initial/Assessment Note    Patient Details  Name: Ronald Adkins MRN: [id number] Date of Birth: [birth year]  [facility] of [facility]) CM/SW Contact:    Shelbie Hutching, RN Phone Number: 11/13/2018, 12:10 PM  Clinical Narrative:                 Patient admitted with stroke.  Patient is from home and lives with his wife.  Patient's RN has reported some confusion this morning but wife is at the bedside and patient seems coherent presently.  Patient and wife would like home health services to start with when discharged and then maybe transition to outpatient PT and OT later.  [place] referral has been given to [place] [facility].  Expected discharge tomorrow.  Patient is current with his PCP.    Expected Discharge Plan: [place] Barriers to Discharge: Continued Medical Work up   Patient Goals and CMS Choice Patient states their goals for this hospitalization and ongoing recovery are:: Ready to go home CMS Medicare.gov Compare Post Acute Care list provided to:: Patient Choice offered to / list presented to : Patient  Expected Discharge Plan and Services Expected Discharge Plan: [place]   Discharge Planning Services: CM Consult Post Acute Care Choice: [place] arrangements for the past 2 months: Single Family Home Expected Discharge Date: 11/13/18                         HH Arranged: PT, RN, OT HH Agency: [facility] Date [place] Agency Contacted: 11/13/18 Time HH Agency Contacted: 1210 Representative spoke with at [place]: Jana Half  Prior Living Arrangements/Services Living arrangements for the past 2 months: [place] with:: Spouse   Do you feel safe going back to the place where you live?: Yes      Need for Family Participation in Patient Care: Yes (Comment)(stroke) Care giver support system in place?: Yes (comment)(wife)   Criminal Activity/Legal Involvement Pertinent  to Current Situation/Hospitalization: No - Comment as needed  Activities of Daily Living Home Assistive Devices/Equipment: None ADL Screening (condition at time of admission) Patient's cognitive ability adequate to safely complete daily activities?: Yes Is the patient deaf or have difficulty hearing?: No Does the patient have difficulty seeing, even when wearing glasses/contacts?: Yes Does the patient have difficulty concentrating, remembering, or making decisions?: No Patient able to express need for assistance with ADLs?: No Does the patient have difficulty dressing or bathing?: Yes Independently performs ADLs?: No Communication: Independent with device (comment) Dressing (OT): Needs assistance Is this a change from baseline?: Change from baseline, expected to last <3days Grooming: Needs assistance Is this a change from baseline?: Change from baseline, expected to last <3 days Feeding: Independent Bathing: Needs assistance Is this a change from baseline?: Change from baseline, expected to last <3 days Toileting: Independent In/Out Bed: Needs assistance Is this a change from baseline?: Change from baseline, expected to last <3 days Walks in Home: Needs assistance Is this a change from baseline?: Change from baseline, expected to last >3 days Does the patient have difficulty walking or climbing stairs?: Yes Weakness of Legs: Right Weakness of Arms/Hands: None  Permission Sought/Granted Permission sought to share information with : Case Manager, Customer service manager, Family Supports Permission granted to share information with : Yes, Verbal Permission Granted     Permission granted to share info w AGENCY: [place] granted  to share info w Relationship: Wife     Emotional Assessment Appearance:: Appears stated age Attitude/Demeanor/Rapport: Engaged Affect (typically observed): Accepting Orientation: : Oriented to Self, Oriented to Place Alcohol /  Substance Use: Not Applicable Psych Involvement: No (comment)  Admission diagnosis:  Diplopia [H53.2] Patient Active Problem List   Diagnosis Date Noted  . Acute CVA (cerebrovascular accident) ([place]) 11/11/2018   PCP:  Rusty Aus, MD Pharmacy:   [facility], [address] [place] [address] Phone: [phone] Fax: [phone]     Social Determinants of Health (SDOH) Interventions    Readmission Risk Interventions No flowsheet data found.

## 2018-11-13 NOTE — Progress Notes (Signed)
Physical Therapy Treatment Patient Details Name: Ronald Adkins MRN: [id number] DOB: April 29, 1945 Today's Date: 11/13/2018    History of Present Illness Pt is a 73 y.o. male presenting to hospital 11/11/18 with double vision, dysarthria, and L facial droop.  MRI brain showing multi-focal acute nonhemorrhagic infarcts in various vascular distribution suggesting a central embolic source (8 mm acute nonhemorrhagic infarct anterior R thalamus-may involve internal capsule; acute infarct L occipital lobe; small infarcts acute posterior L temporal lobe, and R occipital lobe, and high R parietal white matter.  PMH includes prostate CA, CVA, and vascular dementia.    PT Comments    Pt resting in bed upon PT arrival; wife present.  Pt reporting double vision and pt alternating with closing R or L eye during activities to improve balance (pt reports he feels more unsteady with both eyes open).  Pt ambulatory with SBA to CGA around nursing loop; occasional unsteadiness/mild loss of balance with turns and dynamic balance activities but pt able to self correct each time.  Educated pt and pt's wife on higher level balance concerns on level surfaces and safety concerns with balance on uneven surfaces outside: pt did not say much in regards to this education but pt's wife verbalizing understanding.  Will continue to focus on strengthening and higher level balance activities.   Follow Up Recommendations  Outpatient PT;Supervision - Intermittent     Equipment Recommendations  None recommended by PT    Recommendations for Other Services OT consult     Precautions / Restrictions Precautions Precautions: Fall Restrictions Weight Bearing Restrictions: No    Mobility  Bed Mobility Overal bed mobility: Independent             General bed mobility comments: Semi-supine to/from sit without any noted difficulties  Transfers Overall transfer level: Independent Equipment used: None Transfers: Sit  to/from [employer] to Stand: Independent Stand pivot transfers: Min guard       General transfer comment: CGA for safety when turning during transfers  Ambulation/Gait Ambulation/Gait assistance: Min guard;Supervision Gait Distance (Feet): 400 Feet Assistive device: None Gait Pattern/deviations: Step-through pattern Gait velocity: mildly decreased   General Gait Details: step through gait pattern; occasional unsteadiness with turns but pt able to self correct   Stairs Stairs: Yes Stairs assistance: Min guard Stair Management: One rail Right;Forwards Number of Stairs: 6 General stair comments: alternating pattern ascending steps; step to pattern descending steps; mild loss of balance turning at top of stairs but pt able to self correct   Wheelchair Mobility    Modified Rankin (Stroke Patients Only)       Balance Overall balance assessment: Needs assistance Sitting-balance support: No upper extremity supported;Feet supported Sitting balance-Leahy Scale: Normal Sitting balance - Comments: steady sitting reaching outside BOS   Standing balance support: No upper extremity supported;During functional activity Standing balance-Leahy Scale: Good Standing balance comment: pt with mild loss of balance with turns but pt able to self correct during ambulation               High Level Balance Comments: pt able to look R/L/up/down, increase/decrease speed, and turn 180 degrees and stop with mild loss of balance at times but pt always able to self correct            Cognition Arousal/Alertness: Awake/alert Behavior During Therapy: Impulsive(occasionally) Overall Cognitive Status: Impaired/Different from baseline Area of Impairment: Attention;Memory;Safety/judgement;Following commands;Awareness;Problem solving  Current Attention Level: Selective Memory: Decreased short-term memory Following Commands: Follows multi-step  commands inconsistently Safety/Judgement: Decreased awareness of deficits;Decreased awareness of safety Awareness: Anticipatory Problem Solving: Requires verbal cues;Slow processing General Comments: pt tends to joke during session      Exercises      General Comments        Pertinent Vitals/Pain Pain Assessment: No/denies pain  Vitals (HR and O2 on room air) stable and WFL throughout treatment session.    Home Living                      Prior Function            PT Goals (current goals can now be found in the care plan section) Acute Rehab PT Goals Patient Stated Goal: to go home PT Goal Formulation: With patient Time For Goal Achievement: 11/26/18 Potential to Achieve Goals: Good Progress towards PT goals: Progressing toward goals    Frequency    7X/week      PT Plan Current plan remains appropriate    Co-evaluation              AM-PAC PT "6 Clicks" Mobility   Outcome Measure  Help needed turning from your back to your side while in a flat bed without using bedrails?: None Help needed moving from lying on your back to sitting on the side of a flat bed without using bedrails?: None Help needed moving to and from a bed to a chair (including a wheelchair)?: A Little Help needed standing up from a chair using your arms (e.g., wheelchair or bedside chair)?: None Help needed to walk in hospital room?: A Little Help needed climbing 3-5 steps with a railing? : A Little 6 Click Score: 21    End of Session Equipment Utilized During Treatment: Gait belt Activity Tolerance: Patient tolerated treatment well Patient left: in bed;with call bell/phone within reach;with bed alarm set;with family/visitor present Nurse Communication: Mobility status;Precautions PT Visit Diagnosis: Unsteadiness on feet (R26.81);Muscle weakness (generalized) (M62.81)     Time: [id number] PT Time Calculation (min) (ACUTE ONLY): 23 min  Charges:  $Gait Training: 8-22  mins $Therapeutic Activity: 8-22 mins                    Leitha Bleak, PT 11/13/18, 11:38 AM [phone]

## 2018-11-13 NOTE — Consult Note (Signed)
[facility] Consultation Note  Patient ID: Ronald Adkins, MRN: [id number], DOB/AGE: [birth year] 73 y.o. Admit date: 11/11/2018   Date of Consult: 11/13/2018 Primary Physician: Rusty Aus, MD Primary Cardiologist: None  Chief Complaint:  Chief Complaint  Patient presents with  . Code Stroke   Reason for Consult: Stroke  HPI: 73 y.o. male with no evidence of significant cardiovascular history having vascular disease although borderline hyperlipidemia.  The patient has had some recent concerns of significant stroke and weakness.  On CAT scan the patient definitely has a stroke at this time with some residual issues and there is concerns for primary cause of this other than vascular disease.  Patient has been placed on appropriate medication management and is slightly improved at this time.  We have discussed the possibility of further intervention including monitoring as well as transesophageal echocardiogram for further sources.  The patient does have a murmur consistent with valvular heart disease which could be likely cause  Past Medical History:  Diagnosis Date  . Dementia ([place])   . Thyroid disease       Surgical History: History reviewed. No pertinent surgical history.   Home Meds: Prior to Admission medications   Medication Sig Start Date End Date Taking? Authorizing Provider  aspirin EC 81 MG tablet Take 81 mg by mouth daily.   Yes [provider]  etodolac (LODINE) 400 MG tablet Take 400 mg by mouth 2 (two) times daily. 10/31/18  Yes [provider]  galantamine (RAZADYNE ER) 16 MG 24 hr capsule Take 16 mg by mouth daily. 10/31/18  Yes [provider]  levothyroxine (SYNTHROID) 100 MCG tablet Take 100 mcg by mouth daily. 10/24/18  Yes [provider]  Multiple Vitamin (MULTI-VITAMIN) tablet Take 1 tablet by mouth daily.   Yes [provider]  vitamin B-12 (CYANOCOBALAMIN) 1000 MCG tablet Take 1,000 mcg by mouth daily.    Yes [provider]    Inpatient Medications:  .  stroke: mapping our early stages of recovery book   Does not apply Once  . aspirin EC  81 mg Oral Daily  . atorvastatin  40 mg Oral q1800  . clopidogrel  75 mg Oral Daily  . enoxaparin (LOVENOX) injection  40 mg Subcutaneous Q24H  . etodolac  400 mg Oral BID  . galantamine  16 mg Oral Daily  . levothyroxine  100 mcg Oral Daily  . multivitamin with minerals  1 tablet Oral Daily  . vitamin B-12  1,000 mcg Oral Daily     Allergies: No Known Allergies  Social History   Socioeconomic History  . Marital status: Single    Spouse name: Not on file  . Number of children: Not on file  . Years of education: Not on file  . Highest education level: Not on file  Occupational History  . Not on file  Social Needs  . Financial resource strain: Not on file  . Food insecurity    Worry: Not on file    Inability: Not on file  . Transportation needs    Medical: Not on file    Non-medical: Not on file  Tobacco Use  . Smoking status: Never Smoker  . Smokeless tobacco: Never Used  Substance and Sexual Activity  . Alcohol use: Yes  . Drug use: Never  . Sexual activity: Not on file  Lifestyle  . Physical activity    Days per week: Not on file    Minutes per session: Not on  file  . Stress: Not on file  Relationships  . Social Herbalist on phone: Not on file    Gets together: Not on file    Attends religious service: Not on file    Active member of club or organization: Not on file    Attends meetings of clubs or organizations: Not on file    Relationship status: Not on file  . Intimate partner violence    Fear of current or ex partner: Not on file    Emotionally abused: Not on file    Physically abused: Not on file    Forced sexual activity: Not on file  Other Topics Concern  . Not on file  Social History Narrative  . Not on file     No family history on file.   Review of Systems Positive for  stroke Negative for: General:  chills, fever, night sweats or weight changes.  Cardiovascular: PND orthopnea syncope dizziness  Dermatological skin lesions rashes Respiratory: Cough congestion Urologic: Frequent urination urination at night and hematuria Abdominal: negative for nausea, vomiting, diarrhea, bright red blood per rectum, melena, or hematemesis Neurologic: negative for visual changes, and/or hearing changes  All other systems reviewed and are otherwise negative except as noted above.  Labs: No results for input(s): CKTOTAL, CKMB, TROPONINI in the last 72 hours. Lab Results  Component Value Date   WBC 5.6 11/11/2018   HGB 13.5 11/11/2018   HCT 40.2 11/11/2018   MCV 95.3 11/11/2018   PLT 154 11/11/2018    Recent Labs  Lab 11/11/18 1633  NA 139  K 4.0  CL 108  CO2 23  BUN 28*  CREATININE 1.01  CALCIUM 8.9  PROT 6.4*  BILITOT 0.7  ALKPHOS 67  ALT 20  AST 30  GLUCOSE 96   Lab Results  Component Value Date   CHOL 149 11/11/2018   HDL 50 11/11/2018   LDLCALC 89 11/11/2018   TRIG 51 11/11/2018   No results found for: DDIMER  Radiology/Studies:  Ct Angio Head W Or Wo Contrast  Result Date: 11/11/2018 CLINICAL DATA:  Focal neuro deficit for greater than 6 hours. Diplopia. Slurred speech. Difficulty walking. Right facial droop. EXAM: CT ANGIOGRAPHY HEAD AND NECK TECHNIQUE: Multidetector CT imaging of the head and neck was performed using the standard protocol during bolus administration of intravenous contrast. Multiplanar CT image reconstructions and MIPs were obtained to evaluate the vascular anatomy. Carotid stenosis measurements (when applicable) are obtained utilizing NASCET criteria, using the distal internal carotid diameter as the denominator. CONTRAST:  36mL OMNIPAQUE IOHEXOL 350 MG/ML SOLN COMPARISON:  None. FINDINGS: CTA NECK FINDINGS Aortic arch: A 3 vessel arch configuration is present. Minimal atherosclerotic calcifications are present in the aortic  arch. There is some tortuosity of the arch without aneurysm. Minimal calcification is present in the origins of the innominate and left common carotid artery without significant stenosis. Right carotid system: The right common carotid artery is mildly tortuous. Minimal calcifications are present at the carotid bifurcation and proximal right ICA without significant stenosis. There is mild tortuosity of the cervical right ICA without significant stenosis. Left carotid system: The left common carotid artery is within normal limits. Atherosclerotic calcifications are present at the left carotid bifurcation and proximal left ICA without a significant stenosis relative to the more distal vessel. Mild tortuosity is present. Vertebral arteries: The left vertebral artery is the dominant vessel. Both vertebral arteries originate from the subclavian arteries. Atherosclerotic calcifications are present at the origin  of the left vertebral artery without a significant stenosis relative to the more distal vessel. There is no significant stenosis of either vertebral artery in the neck. Skeleton: Slight anterolisthesis at C4-5 is degenerative. Chronic loss of disc height is present at C5-6 and C6-7. Degenerative changes extend into the thoracic spine. Leftward curvature is present at the cervicothoracic junction. Rightward curvature is centered at T5-6 Other neck: The soft tissues the neck are otherwise unremarkable. Upper chest: Mild ground-glass attenuation likely reflects atelectasis. Review of the MIP images confirms the above findings CTA HEAD FINDINGS Anterior circulation: The internal carotid arteries are within normal limits from the skull base through the ICA termini. The A1 and M1 segments are normal. Anterior communicating artery is patent. MCA bifurcations are intact. The ACA and MCA branch vessels are within normal limits. Posterior circulation: The vertebral arteries are codominant. PICA origins are visualized and  normal. The vertebrobasilar junction is normal. Basilar artery is mildly ectatic. Both posterior cerebral arteries originate from the basilar tip. The PCA branch vessels are within normal limits. Venous sinuses: The dural sinuses are patent. Straight sinus deep cerebral veins are intact. Cortical veins are unremarkable. There is no significant vascular malformation. Anatomic variants: None Review of the MIP images confirms the above findings IMPRESSION: 1. No emergent large vessel occlusion. 2. Tortuosity of the cervical vasculature without significant stenosis. This is nonspecific, but commonly seen in the setting of chronic hypertension. 3. Degenerative changes of the cervical spine. Electronically Signed   By: San Morelle M.D.   On: 11/11/2018 20:02   Ct Angio Neck W Or Wo Contrast  Result Date: 11/11/2018 CLINICAL DATA:  Focal neuro deficit for greater than 6 hours. Diplopia. Slurred speech. Difficulty walking. Right facial droop. EXAM: CT ANGIOGRAPHY HEAD AND NECK TECHNIQUE: Multidetector CT imaging of the head and neck was performed using the standard protocol during bolus administration of intravenous contrast. Multiplanar CT image reconstructions and MIPs were obtained to evaluate the vascular anatomy. Carotid stenosis measurements (when applicable) are obtained utilizing NASCET criteria, using the distal internal carotid diameter as the denominator. CONTRAST:  44mL OMNIPAQUE IOHEXOL 350 MG/ML SOLN COMPARISON:  None. FINDINGS: CTA NECK FINDINGS Aortic arch: A 3 vessel arch configuration is present. Minimal atherosclerotic calcifications are present in the aortic arch. There is some tortuosity of the arch without aneurysm. Minimal calcification is present in the origins of the innominate and left common carotid artery without significant stenosis. Right carotid system: The right common carotid artery is mildly tortuous. Minimal calcifications are present at the carotid bifurcation and proximal  right ICA without significant stenosis. There is mild tortuosity of the cervical right ICA without significant stenosis. Left carotid system: The left common carotid artery is within normal limits. Atherosclerotic calcifications are present at the left carotid bifurcation and proximal left ICA without a significant stenosis relative to the more distal vessel. Mild tortuosity is present. Vertebral arteries: The left vertebral artery is the dominant vessel. Both vertebral arteries originate from the subclavian arteries. Atherosclerotic calcifications are present at the origin of the left vertebral artery without a significant stenosis relative to the more distal vessel. There is no significant stenosis of either vertebral artery in the neck. Skeleton: Slight anterolisthesis at C4-5 is degenerative. Chronic loss of disc height is present at C5-6 and C6-7. Degenerative changes extend into the thoracic spine. Leftward curvature is present at the cervicothoracic junction. Rightward curvature is centered at T5-6 Other neck: The soft tissues the neck are otherwise unremarkable. Upper chest: Mild ground-glass  attenuation likely reflects atelectasis. Review of the MIP images confirms the above findings CTA HEAD FINDINGS Anterior circulation: The internal carotid arteries are within normal limits from the skull base through the ICA termini. The A1 and M1 segments are normal. Anterior communicating artery is patent. MCA bifurcations are intact. The ACA and MCA branch vessels are within normal limits. Posterior circulation: The vertebral arteries are codominant. PICA origins are visualized and normal. The vertebrobasilar junction is normal. Basilar artery is mildly ectatic. Both posterior cerebral arteries originate from the basilar tip. The PCA branch vessels are within normal limits. Venous sinuses: The dural sinuses are patent. Straight sinus deep cerebral veins are intact. Cortical veins are unremarkable. There is no  significant vascular malformation. Anatomic variants: None Review of the MIP images confirms the above findings IMPRESSION: 1. No emergent large vessel occlusion. 2. Tortuosity of the cervical vasculature without significant stenosis. This is nonspecific, but commonly seen in the setting of chronic hypertension. 3. Degenerative changes of the cervical spine. Electronically Signed   By: San Morelle M.D.   On: 11/11/2018 20:02   Mr Brain Wo Contrast  Result Date: 11/11/2018 CLINICAL DATA:  Slurred speech. Difficulty walking. Right facial droop. EXAM: MRI HEAD WITHOUT CONTRAST TECHNIQUE: Multiplanar, multiecho pulse sequences of the brain and surrounding structures were obtained without intravenous contrast. COMPARISON:  CTA head and neck and CT head without contrast 11/11/2018 FINDINGS: Brain: The diffusion-weighted images demonstrate acute nonhemorrhagic infarct involving the right anterior thalamus measuring up to 8 mm. Linear 6 mm infarct is present in the posterior left temporal lobe on image 25 series 5. A 10 mm left occipital cortical infarct is present. A punctate subcortical right occipital infarct is present. A 3 mm acute nonhemorrhagic white matter infarct is present in the right parietal lobe. Confluent periventricular and subcortical T2 hyperintensities are present bilaterally. There are remote lacunar infarcts involving the basal ganglia bilaterally. White matter changes extend into the brainstem. Remote lacunar infarcts are present in the cerebellum bilaterally. Vascular: Flow is present in the major intracranial arteries. Skull and upper cervical spine: The craniocervical junction is normal. Upper cervical spine is within normal limits. Marrow signal is unremarkable. Sinuses/Orbits: Fluid is present in the right sphenoid sinus. Mucosal disease is present in the maxillary sinuses and anterior ethmoid air cells bilaterally. The mastoid air cells are clear. The globes and orbits are within  normal limits. IMPRESSION: 1. 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 2. 8 mm acute nonhemorrhagic infarct in the anterior right thalamus may involve the internal capsule 3. Acute infarct in the left occipital lobe. 4. Small acute infarcts in the posterior left temporal lobe, right occipital lobe, and high right parietal white matter. 5. 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 6. Sinus disease as described. Electronically Signed   By: San Morelle M.D.   On: 11/11/2018 21:39   Ct Head Code Stroke Wo Contrast  Result Date: 11/11/2018 CLINICAL DATA:  Code stroke. New onset of diplopia in slurred speech. Right facial droop. Last known well 3 hours ago. EXAM: CT HEAD WITHOUT CONTRAST TECHNIQUE: Contiguous axial images were obtained from the base of the skull through the vertex without intravenous contrast. COMPARISON:  CT head without contrast 05/17/2017. FINDINGS: Brain: Moderate diffuse white matter changes are stable. No acute cortical infarct is present. Basal ganglia are unchanged. No acute hemorrhage or mass lesion is present. No significant  extraaxial fluid collection is present. The brainstem and cerebellum are within normal limits. Vascular: Moderate ectasia is present throughout the circle-of-Willis. Atherosclerotic calcifications are present within the cavernous internal carotid arteries and at the dural margin of the vertebral arteries. There is no asymmetric hyperdense vessel. Skull: Calvarium is intact. No focal lytic or blastic lesions are present. 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 Is chronic wall thickening. Bilateral anterior ethmoid mucosal thickening is present. This extends into the inferior left frontal sinus. There is fluid in the right sphenoid sinus. Mastoid air cells are clear.  The globes and orbits are within normal limits. ASPECTS [facility] Stroke Program Early CT Score) - Ganglionic level infarction (caudate, lentiform nuclei, internal capsule, insula, M1-M3 cortex): 7/7 - Supraganglionic infarction (M4-M6 cortex): 3/3 Total score (0-10 with 10 being normal): 10/10 IMPRESSION: 1. No acute intracranial abnormality or significant interval change. 2. Stable moderate diffuse white matter disease. This likely reflects the sequela of chronic microvascular ischemia. 3. Intracranial atherosclerosis moderate ectasia throughout the circle-of-Willis. 4. ASPECTS is 10/10 These results were called by telephone at the time of interpretation on 11/11/2018 at 4:17 pm to [facility] , who verbally acknowledged these results. Electronically Signed   By: San Morelle M.D.   On: 11/11/2018 16:19    EKG: Normal sinus rhythm  Weights: Filed Weights   11/11/18 1818  Weight: 86.2 kg     Physical Exam: Blood pressure (!) 134/94, pulse 68, temperature 97.8 F (36.6 C), temperature source Oral, resp. rate 16, height 6' (1.829 m), weight 86.2 kg, SpO2 97 %. Body mass index is 25.77 kg/m. General: Well developed, well nourished, in no acute distress. Head eyes ears nose throat: Normocephalic, atraumatic, sclera non-icteric, no xanthomas, nares are without discharge. No apparent thyromegaly and/or mass  Lungs: Normal respiratory effort.  no wheezes, no rales, no rhonchi.  Heart: RRR with normal S1 S2. no murmur gallop, no rub, PMI is normal size and placement, carotid upstroke normal without bruit, jugular venous pressure is normal Abdomen: Soft, non-tender, non-distended with normoactive bowel sounds. No hepatomegaly. No rebound/guarding. No obvious abdominal masses. Abdominal aorta is normal size without bruit Extremities: No edema. no cyanosis, no clubbing, no ulcers  Peripheral : 2+ bilateral upper extremity pulses, 2+ bilateral femoral pulses, 2+ bilateral dorsal pedal  pulse Neuro: Alert and oriented.  Some facial asymmetry.  Some focal deficit. Moves all extremities spontaneously. Musculoskeletal: Normal muscle tone without kyphosis Psych:  Responds to questions appropriately with a normal affect.    Assessment: 73 year old male with acute stroke with no current primary cause needing further intervention including transesophageal echocardiogram.  Patient understands the risk and benefit score of transesophageal echocardiogram.  This includes a possibility of death stroke heart attack esophageal perforation sore throat and medication management side effects  Plan: 1.  Proceed to transesophageal echocardiogram 2.  Telemetry to assess for rhythm disturbances causing above 3.  Further treatment options after above  Signed, Corey Skains M.D. [facility] 11/13/2018, 1:30 PM

## 2018-11-13 NOTE — Progress Notes (Signed)
[place] [facility] [place] NAME: Ronald Adkins    MR#:  [id number]  DATE OF BIRTH:  [birth year]  SUBJECTIVE:   Patient still has double vision left eye  REVIEW OF SYSTEMS:    Review of Systems  Constitutional: Negative for fever, chills weight loss HENT: Negative for ear pain, nosebleeds, congestion, facial swelling, rhinorrhea, neck pain, neck stiffness and ear discharge.   Respiratory: Negative for cough, shortness of breath, wheezing  Cardiovascular: Negative for chest pain, palpitations and leg swelling.  Gastrointestinal: Negative for heartburn, abdominal pain, vomiting, diarrhea or consitpation Genitourinary: Negative for dysuria, urgency, frequency, hematuria Musculoskeletal: Negative for back pain or joint pain Neurological: Negative for dizziness, seizures, syncope, focal weakness,  numbness and headaches.  + Double vision Reports no numbness of face Hematological: Does not bruise/bleed easily.  Psychiatric/Behavioral: Negative for hallucinations, confusion, dysphoric mood    Tolerating Diet: yes      DRUG ALLERGIES:  No Known Allergies  VITALS:  Blood pressure (!) 134/94, pulse 68, temperature 97.8 F (36.6 C), temperature source Oral, resp. rate 16, height 6' (1.829 m), weight 86.2 kg, SpO2 97 %.  PHYSICAL EXAMINATION:  Constitutional: Appears well-developed and well-nourished. No distress. HENT: Normocephalic. Marland Kitchen Oropharynx is clear and moist.  Eyes: Conjunctivae and EOM are normal. PERRLA, no scleral icterus.  Neck: Normal ROM. Neck supple. No JVD. No tracheal deviation. CVS: RRR, S1/S2 +, no murmurs, no gallops, no carotid bruit.  Pulmonary: Effort and breath sounds normal, no stridor, rhonchi, wheezes, rales.  Abdominal: Soft. BS +,  no distension, tenderness, rebound or guarding.  Musculoskeletal: Normal range of motion. No edema and no tenderness.  Neuro: Alert. CN 2-12 grossly intact. Mild left facial  droop Reports double vision left eye Skin: Skin is warm and dry. No rash noted. Psychiatric: Normal mood and affect.      LABORATORY PANEL:   CBC Recent Labs  Lab 11/11/18 1633  WBC 5.6  HGB 13.5  HCT 40.2  PLT 154   ------------------------------------------------------------------------------------------------------------------  Chemistries  Recent Labs  Lab 11/11/18 1633  NA 139  K 4.0  CL 108  CO2 23  GLUCOSE 96  BUN 28*  CREATININE 1.01  CALCIUM 8.9  AST 30  ALT 20  ALKPHOS 67  BILITOT 0.7   ------------------------------------------------------------------------------------------------------------------  Cardiac Enzymes No results for input(s): TROPONINI in the last 168 hours. ------------------------------------------------------------------------------------------------------------------  RADIOLOGY:  Ct Angio Head W Or Wo Contrast  Result Date: 11/11/2018 CLINICAL DATA:  Focal neuro deficit for greater than 6 hours. Diplopia. Slurred speech. Difficulty walking. Right facial droop. EXAM: CT ANGIOGRAPHY HEAD AND NECK TECHNIQUE: Multidetector CT imaging of the head and neck was performed using the standard protocol during bolus administration of intravenous contrast. Multiplanar CT image reconstructions and MIPs were obtained to evaluate the vascular anatomy. Carotid stenosis measurements (when applicable) are obtained utilizing NASCET criteria, using the distal internal carotid diameter as the denominator. CONTRAST:  66mL OMNIPAQUE IOHEXOL 350 MG/ML SOLN COMPARISON:  None. FINDINGS: CTA NECK FINDINGS Aortic arch: A 3 vessel arch configuration is present. Minimal atherosclerotic calcifications are present in the aortic arch. There is some tortuosity of the arch without aneurysm. Minimal calcification is present in the origins of the innominate and left common carotid artery without significant stenosis. Right carotid system: The right common carotid artery is mildly  tortuous. Minimal calcifications are present at the carotid bifurcation and proximal right ICA without significant stenosis. There is mild tortuosity of the cervical right ICA without significant  stenosis. Left carotid system: The left common carotid artery is within normal limits. Atherosclerotic calcifications are present at the left carotid bifurcation and proximal left ICA without a significant stenosis relative to the more distal vessel. Mild tortuosity is present. Vertebral arteries: The left vertebral artery is the dominant vessel. Both vertebral arteries originate from the subclavian arteries. Atherosclerotic calcifications are present at the origin of the left vertebral artery without a significant stenosis relative to the more distal vessel. There is no significant stenosis of either vertebral artery in the neck. Skeleton: Slight anterolisthesis at C4-5 is degenerative. Chronic loss of disc height is present at C5-6 and C6-7. Degenerative changes extend into the thoracic spine. Leftward curvature is present at the cervicothoracic junction. Rightward curvature is centered at T5-6 Other neck: The soft tissues the neck are otherwise unremarkable. Upper chest: Mild ground-glass attenuation likely reflects atelectasis. Review of the MIP images confirms the above findings CTA HEAD FINDINGS Anterior circulation: The internal carotid arteries are within normal limits from the skull base through the ICA termini. The A1 and M1 segments are normal. Anterior communicating artery is patent. MCA bifurcations are intact. The ACA and MCA branch vessels are within normal limits. Posterior circulation: The vertebral arteries are codominant. PICA origins are visualized and normal. The vertebrobasilar junction is normal. Basilar artery is mildly ectatic. Both posterior cerebral arteries originate from the basilar tip. The PCA branch vessels are within normal limits. Venous sinuses: The dural sinuses are patent. Straight sinus  deep cerebral veins are intact. Cortical veins are unremarkable. There is no significant vascular malformation. Anatomic variants: None Review of the MIP images confirms the above findings IMPRESSION: 1. No emergent large vessel occlusion. 2. Tortuosity of the cervical vasculature without significant stenosis. This is nonspecific, but commonly seen in the setting of chronic hypertension. 3. Degenerative changes of the cervical spine. Electronically Signed   By: San Morelle M.D.   On: 11/11/2018 20:02   Ct Angio Neck W Or Wo Contrast  Result Date: 11/11/2018 CLINICAL DATA:  Focal neuro deficit for greater than 6 hours. Diplopia. Slurred speech. Difficulty walking. Right facial droop. EXAM: CT ANGIOGRAPHY HEAD AND NECK TECHNIQUE: Multidetector CT imaging of the head and neck was performed using the standard protocol during bolus administration of intravenous contrast. Multiplanar CT image reconstructions and MIPs were obtained to evaluate the vascular anatomy. Carotid stenosis measurements (when applicable) are obtained utilizing NASCET criteria, using the distal internal carotid diameter as the denominator. CONTRAST:  65mL OMNIPAQUE IOHEXOL 350 MG/ML SOLN COMPARISON:  None. FINDINGS: CTA NECK FINDINGS Aortic arch: A 3 vessel arch configuration is present. Minimal atherosclerotic calcifications are present in the aortic arch. There is some tortuosity of the arch without aneurysm. Minimal calcification is present in the origins of the innominate and left common carotid artery without significant stenosis. Right carotid system: The right common carotid artery is mildly tortuous. Minimal calcifications are present at the carotid bifurcation and proximal right ICA without significant stenosis. There is mild tortuosity of the cervical right ICA without significant stenosis. Left carotid system: The left common carotid artery is within normal limits. Atherosclerotic calcifications are present at the left carotid  bifurcation and proximal left ICA without a significant stenosis relative to the more distal vessel. Mild tortuosity is present. Vertebral arteries: The left vertebral artery is the dominant vessel. Both vertebral arteries originate from the subclavian arteries. Atherosclerotic calcifications are present at the origin of the left vertebral artery without a significant stenosis relative to the more distal vessel.  There is no significant stenosis of either vertebral artery in the neck. Skeleton: Slight anterolisthesis at C4-5 is degenerative. Chronic loss of disc height is present at C5-6 and C6-7. Degenerative changes extend into the thoracic spine. Leftward curvature is present at the cervicothoracic junction. Rightward curvature is centered at T5-6 Other neck: The soft tissues the neck are otherwise unremarkable. Upper chest: Mild ground-glass attenuation likely reflects atelectasis. Review of the MIP images confirms the above findings CTA HEAD FINDINGS Anterior circulation: The internal carotid arteries are within normal limits from the skull base through the ICA termini. The A1 and M1 segments are normal. Anterior communicating artery is patent. MCA bifurcations are intact. The ACA and MCA branch vessels are within normal limits. Posterior circulation: The vertebral arteries are codominant. PICA origins are visualized and normal. The vertebrobasilar junction is normal. Basilar artery is mildly ectatic. Both posterior cerebral arteries originate from the basilar tip. The PCA branch vessels are within normal limits. Venous sinuses: The dural sinuses are patent. Straight sinus deep cerebral veins are intact. Cortical veins are unremarkable. There is no significant vascular malformation. Anatomic variants: None Review of the MIP images confirms the above findings IMPRESSION: 1. No emergent large vessel occlusion. 2. Tortuosity of the cervical vasculature without significant stenosis. This is nonspecific, but commonly  seen in the setting of chronic hypertension. 3. Degenerative changes of the cervical spine. Electronically Signed   By: San Morelle M.D.   On: 11/11/2018 20:02   Mr Brain Wo Contrast  Result Date: 11/11/2018 CLINICAL DATA:  Slurred speech. Difficulty walking. Right facial droop. EXAM: MRI HEAD WITHOUT CONTRAST TECHNIQUE: Multiplanar, multiecho pulse sequences of the brain and surrounding structures were obtained without intravenous contrast. COMPARISON:  CTA head and neck and CT head without contrast 11/11/2018 FINDINGS: Brain: The diffusion-weighted images demonstrate acute nonhemorrhagic infarct involving the right anterior thalamus measuring up to 8 mm. Linear 6 mm infarct is present in the posterior left temporal lobe on image 25 series 5. A 10 mm left occipital cortical infarct is present. A punctate subcortical right occipital infarct is present. A 3 mm acute nonhemorrhagic white matter infarct is present in the right parietal lobe. Confluent periventricular and subcortical T2 hyperintensities are present bilaterally. There are remote lacunar infarcts involving the basal ganglia bilaterally. White matter changes extend into the brainstem. Remote lacunar infarcts are present in the cerebellum bilaterally. Vascular: Flow is present in the major intracranial arteries. Skull and upper cervical spine: The craniocervical junction is normal. Upper cervical spine is within normal limits. Marrow signal is unremarkable. Sinuses/Orbits: Fluid is present in the right sphenoid sinus. Mucosal disease is present in the maxillary sinuses and anterior ethmoid air cells bilaterally. The mastoid air cells are clear. The globes and orbits are within normal limits. IMPRESSION: 1. 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 2. 8 mm acute nonhemorrhagic infarct in the anterior right thalamus may involve the internal capsule 3. Acute infarct in the left  occipital lobe. 4. Small acute infarcts in the posterior left temporal lobe, right occipital lobe, and high right parietal white matter. 5. 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 6. Sinus disease as described. Electronically Signed   By: San Morelle M.D.   On: 11/11/2018 21:39   Ct Head Code Stroke Wo Contrast  Result Date: 11/11/2018 CLINICAL DATA:  Code stroke. New onset of diplopia in slurred speech. Right facial droop.  Last known well 3 hours ago. EXAM: CT HEAD WITHOUT CONTRAST TECHNIQUE: Contiguous axial images were obtained from the base of the skull through the vertex without intravenous contrast. COMPARISON:  CT head without contrast 05/17/2017. FINDINGS: Brain: Moderate diffuse white matter changes are stable. No acute cortical infarct is present. Basal ganglia are unchanged. No acute hemorrhage or mass lesion is present. No significant extraaxial fluid collection is present. The brainstem and cerebellum are within normal limits. Vascular: Moderate ectasia is present throughout the circle-of-Willis. Atherosclerotic calcifications are present within the cavernous internal carotid arteries and at the dural margin of the vertebral arteries. There is no asymmetric hyperdense vessel. Skull: Calvarium is intact. No focal lytic or blastic lesions are present. 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 Is chronic wall thickening. Bilateral anterior ethmoid mucosal thickening is present. This extends into the inferior left frontal sinus. There is fluid in the right sphenoid sinus. Mastoid air cells are clear. The globes and orbits are within normal limits. ASPECTS [facility] Stroke Program Early CT Score) - Ganglionic level infarction (caudate, lentiform nuclei, internal capsule, insula, M1-M3 cortex): 7/7 - Supraganglionic infarction (M4-M6 cortex): 3/3 Total score (0-10 with  10 being normal): 10/10 IMPRESSION: 1. No acute intracranial abnormality or significant interval change. 2. Stable moderate diffuse white matter disease. This likely reflects the sequela of chronic microvascular ischemia. 3. Intracranial atherosclerosis moderate ectasia throughout the circle-of-Willis. 4. ASPECTS is 10/10 These results were called by telephone at the time of interpretation on 11/11/2018 at 4:17 pm to [facility] , who verbally acknowledged these results. Electronically Signed   By: San Morelle M.D.   On: 11/11/2018 16:19     ASSESSMENT AND PLAN:   73 year old male with vascular dementia and hypothyroidism who presented to ER with double vision and left facial droop.  1.  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 A1c 5.5 LDL 89 Cardiology consult placed for TEE to [facility] via [facility]. Continue aspirin and Plavix for 3 weeks and change Plavix 75 mg as monotherapy after that time if TEE negative for cardiac source. Continue statin. Outpatient PT/OT .   2.  Hypothyroid continue Synthroid  3.  Dementia: Continue galantamine       Management plans discussed with the patient and he is in agreement.  CODE STATUS: DNR  TOTAL TIME TAKING CARE OF THIS PATIENT: 27 minutes.  D/w wife yesterday   POSSIBLE D/C tomorrow, DEPENDING ON CLINICAL CONDITION.   Bettey Costa M.D on 11/13/2018 at 10:16 AM  Between 7am to 6pm - Pager - [phone] After 6pm go to [email or web address] - password [email or web address] [facility]  Office  [phone]  CC: Primary care physician; Rusty Aus, MD  Note: This dictation was prepared with Dragon dictation along with smaller phrase technology. Any transcriptional errors that result from this process are unintentional.

## 2018-11-14 ENCOUNTER — Encounter: Payer: Self-pay | Admitting: Radiology

## 2018-11-14 ENCOUNTER — Inpatient Hospital Stay
Admit: 2018-11-14 | Discharge: 2018-11-14 | Disposition: A | Discharge status: Home / Self Care | Payer: PPO | Attending: Internal Medicine | Admitting: Internal Medicine

## 2018-11-14 ENCOUNTER — Inpatient Hospital Stay: Payer: PPO

## 2018-11-14 ENCOUNTER — Encounter
Admission: EM | Disposition: A | Discharge status: Home Health | Payer: Self-pay | Source: Home / Self Care | Attending: Internal Medicine

## 2018-11-14 HISTORY — PX: TEE WITHOUT CARDIOVERSION: SHX5443

## 2018-11-14 SURGERY — ECHOCARDIOGRAM, TRANSESOPHAGEAL
Anesthesia: Moderate Sedation

## 2018-11-14 MED ORDER — LIDOCAINE VISCOUS HCL 2 % MT SOLN
OROMUCOSAL | Status: AC | PRN
  Administered 2018-11-14: 15 mL via OROMUCOSAL

## 2018-11-14 MED ORDER — SODIUM CHLORIDE 0.9 % IV SOLN: INTRAVENOUS | Status: DC

## 2018-11-14 MED ORDER — LIDOCAINE VISCOUS HCL 2 % MT SOLN
OROMUCOSAL | Status: AC
  Administered 2018-11-14: 09:00:00
  Filled 2018-11-14: qty 15

## 2018-11-14 MED ORDER — CLOPIDOGREL BISULFATE 75 MG PO TABS: 75.0000 mg | ORAL_TABLET | Freq: Every day | ORAL | 0 refills | Status: DC

## 2018-11-14 MED ORDER — BUTAMBEN-TETRACAINE-BENZOCAINE 2-2-14 % EX AERO
INHALATION_SPRAY | CUTANEOUS | Status: AC
  Administered 2018-11-14: 09:00:00
  Filled 2018-11-14: qty 5

## 2018-11-14 MED ORDER — FENTANYL CITRATE (PF) 100 MCG/2ML IJ SOLN
INTRAMUSCULAR | Status: AC
  Administered 2018-11-14: 09:00:00
  Filled 2018-11-14: qty 2

## 2018-11-14 MED ORDER — BUTAMBEN-TETRACAINE-BENZOCAINE 2-2-14 % EX AERO
INHALATION_SPRAY | CUTANEOUS | Status: AC | PRN
  Administered 2018-11-14: 10 via TOPICAL

## 2018-11-14 MED ORDER — ATORVASTATIN CALCIUM 40 MG PO TABS: 40.0000 mg | ORAL_TABLET | Freq: Every day | ORAL | 0 refills | Status: DC

## 2018-11-14 MED ORDER — MIDAZOLAM HCL 5 MG/5ML IJ SOLN
INTRAMUSCULAR | Status: AC
  Administered 2018-11-14: 09:00:00
  Filled 2018-11-14: qty 5

## 2018-11-14 MED ORDER — FENTANYL CITRATE (PF) 100 MCG/2ML IJ SOLN
INTRAMUSCULAR | Status: AC | PRN
  Administered 2018-11-14: 25 ug via INTRAVENOUS
  Administered 2018-11-14: 50 ug via INTRAVENOUS

## 2018-11-14 MED ORDER — MIDAZOLAM HCL 5 MG/5ML IJ SOLN
INTRAMUSCULAR | Status: AC | PRN
  Administered 2018-11-14: 2 mg via INTRAVENOUS
  Administered 2018-11-14: 1 mg via INTRAVENOUS

## 2018-11-14 MED ORDER — SODIUM CHLORIDE FLUSH 0.9 % IV SOLN
INTRAVENOUS | Status: AC
  Administered 2018-11-14: 09:00:00
  Filled 2018-11-14: qty 10

## 2018-11-14 MED ORDER — IOHEXOL 350 MG/ML SOLN
75.0000 mL | Freq: Once | INTRAVENOUS | Status: AC | PRN
  Administered 2018-11-14: 75 mL via INTRAVENOUS

## 2018-11-14 NOTE — CV Procedure (Signed)
Transesophageal echocardiogram preliminary report  DAXEN SCOLLON [id number] 1945-12-20  Preliminary diagnosis  Stroke with possible embolic source  Postprocedural diagnosis  Stroke with possible embolic source including bicuspid aortic valve with friable mobile tissue  Time out A timeout was performed by the nursing staff and physicians specifically identifying the procedure performed, identification of the patient, the type of sedation, all allergies and medications, all pertinent medical history, and presedation assessment of nasopharynx. The patient and or family understand the risks of the procedure including the rare risks of death, stroke, heart attack, esophogeal perforation, sore throat, and reaction to medications given.  Moderate sedation During this procedure the patient has received Versed 3 milligrams and fentanyl 75 micrograms to achieve appropriate moderate sedation.  The patient had continued monitoring of heart rate, oxygenation, blood pressure, respiratory rate, and extent of signs of sedation throughout the entire procedure.  The patient received this moderate sedation over a period of 20 minutes.  Both the nursing staff and I were present during the procedure when the patient had moderate sedation for 100% of the time.  Treatment considerations  Further tx options for bicuspid valve with aortic dilation and mobile aortic valve tissue  For further details of transesophageal echocardiogram please refer to final report.  Signed,  Corey Skains M.D. [facility] 11/14/2018 9:08 AM

## 2018-11-14 NOTE — Sedation Documentation (Signed)
Total sedation: Versed 3 mg IV, Fentanyl 75 mcg IV. Viscous lido and Cetacaine orally (given by MD). Pt. Tolerated procedure well.

## 2018-11-14 NOTE — Plan of Care (Signed)
Pt is d/ced home with [facility] - will have nurse, PT/OT and speech.

## 2018-11-14 NOTE — Discharge Summary (Signed)
[place] [facility] NAME: Ronald Adkins    MR#:  [id number]  DATE OF BIRTH:  27-Nov-1945  DATE OF ADMISSION:  11/11/2018 ADMITTING PHYSICIAN: Lang Snow, NP  DATE OF DISCHARGE: 11/14/2018  PRIMARY CARE PHYSICIAN: Rusty Aus, MD    ADMISSION DIAGNOSIS:  Diplopia [H53.2]  DISCHARGE DIAGNOSIS:  Active Problems:   Acute CVA (cerebrovascular accident) ([place])   SECONDARY DIAGNOSIS:   Past Medical History:  Diagnosis Date  . Dementia ([place])   . Thyroid disease     HOSPITAL COURSE:   73 year old male with vascular dementia and hypothyroidism who presented to ER with double vision and left facial droop.  1.  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 A1c 5.5 LDL 89 He underwent TEE.  TEE did not show vegetation however it does show a large aorta.  Dr. Jose Persia has recommended CT scan of the aorta.  Patient will have follow-up tomorrow regarding the results of the CT scan as well as the possibility of valve surgery at some point.   He will continue aspirin and Plavix for 3 weeks and change Plavix 75 mg as monotherapy  He will continue statin therapy with LDL goal less than 70.  He will have  home health care services. 2.  Hypothyroid continue Synthroid  3.  Dementia: Continue galantamine   DISCHARGE CONDITIONS AND DIET:   Stable for discharge heart healthy diet  CONSULTS OBTAINED:  Treatment Team:  Alexis Goodell, MD  DRUG ALLERGIES:  No Known Allergies  DISCHARGE MEDICATIONS:   Allergies as of 11/14/2018   No Known Allergies     Medication List    TAKE these medications   aspirin EC 81 MG tablet Take 81 mg by mouth daily.   atorvastatin 40 MG tablet Commonly known as: LIPITOR Take 1 tablet (40 mg total) by mouth daily at 6 PM.   clopidogrel 75 MG tablet Commonly known as: PLAVIX Take 1 tablet (75 mg total) by mouth daily.    etodolac 400 MG tablet Commonly known as: LODINE Take 400 mg by mouth 2 (two) times daily.   galantamine 16 MG 24 hr capsule Commonly known as: RAZADYNE ER Take 16 mg by mouth daily.   levothyroxine 100 MCG tablet Commonly known as: SYNTHROID Take 100 mcg by mouth daily.   Multi-Vitamin tablet Take 1 tablet by mouth daily.   vitamin B-12 1000 MCG tablet Commonly known as: CYANOCOBALAMIN Take 1,000 mcg by mouth daily.         Today   CHIEF COMPLAINT:   Patient underwent TEE this morning.   Still with some double vision.   VITAL SIGNS:  Blood pressure (!) 143/86, pulse 67, temperature 98.3 F (36.8 C), temperature source Oral, resp. rate 18, height 6' (1.829 m), weight 86.2 kg, SpO2 95 %.   REVIEW OF SYSTEMS:  Review of Systems  Constitutional: Negative.  Negative for chills, fever and malaise/fatigue.  HENT: Negative.  Negative for ear discharge, ear pain, hearing loss, nosebleeds and sore throat.   Eyes: Positive for double vision. Negative for blurred vision and pain.  Respiratory: Negative.  Negative for cough, hemoptysis, shortness of breath and wheezing.   Cardiovascular: Negative.  Negative for chest pain, palpitations and leg swelling.  Gastrointestinal: Negative.  Negative for abdominal pain, blood in stool, diarrhea, nausea and vomiting.  Genitourinary: Negative.  Negative for dysuria.  Musculoskeletal: Negative.  Negative for back pain.  Skin: Negative.  Neurological: Negative for dizziness, tremors, speech change, focal weakness, seizures and headaches.  Endo/Heme/Allergies: Negative.  Does not bruise/bleed easily.  Psychiatric/Behavioral: Negative.  Negative for depression, hallucinations and suicidal ideas.     PHYSICAL EXAMINATION:  GENERAL:  73 y.o.-year-old patient lying in the bed with no acute distress.  NECK:  Supple, no jugular venous distention. No thyroid enlargement, no tenderness.  LUNGS: Normal breath sounds bilaterally, no wheezing,  rales,rhonchi  No use of accessory muscles of respiration.  CARDIOVASCULAR: S1, S2 normal. No murmurs, rubs, or gallops.  ABDOMEN: Soft, non-tender, non-distended. Bowel sounds present. No organomegaly or mass.  EXTREMITIES: No pedal edema, cyanosis, or clubbing.  PSYCHIATRIC: The patient is alert and oriented x 3.  SKIN: No obvious rash, lesion, or ulcer.   NEURO left facial mild droop with double vision left eye per patient no other focal deficit  DATA REVIEW:   CBC Recent Labs  Lab 11/11/18 1633  WBC 5.6  HGB 13.5  HCT 40.2  PLT 154    Chemistries  Recent Labs  Lab 11/11/18 1633  NA 139  K 4.0  CL 108  CO2 23  GLUCOSE 96  BUN 28*  CREATININE 1.01  CALCIUM 8.9  AST 30  ALT 20  ALKPHOS 67  BILITOT 0.7    Cardiac Enzymes No results for input(s): TROPONINI in the last 168 hours.  Microbiology Results  @[email or web address] @  RADIOLOGY:  No results found.    Allergies as of 11/14/2018   No Known Allergies     Medication List    TAKE these medications   aspirin EC 81 MG tablet Take 81 mg by mouth daily.   atorvastatin 40 MG tablet Commonly known as: LIPITOR Take 1 tablet (40 mg total) by mouth daily at 6 PM.   clopidogrel 75 MG tablet Commonly known as: PLAVIX Take 1 tablet (75 mg total) by mouth daily.   etodolac 400 MG tablet Commonly known as: LODINE Take 400 mg by mouth 2 (two) times daily.   galantamine 16 MG 24 hr capsule Commonly known as: RAZADYNE ER Take 16 mg by mouth daily.   levothyroxine 100 MCG tablet Commonly known as: SYNTHROID Take 100 mcg by mouth daily.   Multi-Vitamin tablet Take 1 tablet by mouth daily.   vitamin B-12 1000 MCG tablet Commonly known as: CYANOCOBALAMIN Take 1,000 mcg by mouth daily.         Management plans discussed with the patient and wife and they are in agreement. Stable for discharge home with [facility]  Patient should follow up with neurology and cardiology  CODE STATUS:     Code Status Orders   (From admission, onward)         Start     Ordered   11/12/18 1039  Do not attempt resuscitation (DNR)  Continuous    Question Answer Comment  In the event of cardiac or respiratory ARREST Do not call a "code blue"   In the event of cardiac or respiratory ARREST Do not perform Intubation, CPR, defibrillation or ACLS   In the event of cardiac or respiratory ARREST Use medication by any route, position, wound care, and other measures to relive pain and suffering. May use oxygen, suction and manual treatment of airway obstruction as needed for comfort.      11/12/18 1038        Code Status History    Date Active Date Inactive Code Status Order ID Comments User Context   11/11/2018 1826 11/12/2018 1038 Full Code [id number]  Lang Snow, NP ED   Advance Care Planning Activity      TOTAL TIME TAKING CARE OF THIS PATIENT: 39 minutes.    Note: This dictation was prepared with Dragon dictation along with smaller phrase technology. Any transcriptional errors that result from this process are unintentional.  Bettey Costa M.D on 11/14/2018 at 10:29 AM  Between 7am to 6pm - Pager - [phone] After 6pm go to [email or web address] - password [email or web address] [facility]  Office  [phone]  CC: Primary care physician; Rusty Aus, MD

## 2018-11-14 NOTE — Care Management Important Message (Signed)
Important Message  Patient Details  Name: Ronald Adkins MRN: [id number] Date of Birth: 11/05/1945   Medicare Important Message Given:  Yes     [name] 11/14/2018, 11:29 AM

## 2018-11-14 NOTE — Progress Notes (Signed)
PT Cancellation Note  Patient Details Name: BLY BAE MRN: [id number] DOB: 1945-10-01   Cancelled Treatment:    Reason Eval/Treat Not Completed: Patient at procedure or test/unavailable.  Pt currently off unit for testing.  Will re-attempt PT treatment at a later date/time.  Leitha Bleak, PT 11/14/18, 8:10 AM [phone]

## 2018-11-14 NOTE — Progress Notes (Signed)
*  PRELIMINARY RESULTS* Echocardiogram Echocardiogram Transesophageal has been performed.  Ronald Adkins 11/14/2018, 9:00 AM

## 2018-11-14 NOTE — Progress Notes (Signed)
[facility] Encounter Note  Patient: Ronald Adkins / Admit Date: 11/11/2018 / Date of Encounter: 11/14/2018, 12:42 PM   Subjective: Patient feels well today.  No further evidence of stroke or progression of issues.  Patient does have a heart murmur consistent with aortic valve stenosis Transesophageal echocardiogram showing normal LV systolic function ejection fraction of 60% bicuspid aortic valve moderately enlarged a sending aorta with a small friability of aortic valve possibly a source of embolism if scenario fits to current stroke  Review of Systems: Positive for: None Negative for: Vision change, hearing change, syncope, dizziness, nausea, vomiting,diarrhea, bloody stool, stomach pain, cough, congestion, diaphoresis, urinary frequency, urinary pain,skin lesions, skin rashes Others previously listed  Objective: Telemetry: Normal sinus rhythm Physical Exam: Blood pressure (!) 143/86, pulse 67, temperature 98.3 F (36.8 C), temperature source Oral, resp. rate 18, height 6' (1.829 m), weight 86.2 kg, SpO2 95 %. Body mass index is 25.77 kg/m. General: Well developed, well nourished, in no acute distress. Head: Normocephalic, atraumatic, sclera non-icteric, no xanthomas, nares are without discharge. Neck: No apparent masses Lungs: Normal respirations with no wheezes, no rhonchi, no rales , no crackles   Heart: Regular rate and rhythm, normal S1 S2, 2-3+ aortic murmur, no rub, no gallop, PMI is normal size and placement, carotid upstroke normal without bruit, jugular venous pressure normal Abdomen: Soft, non-tender, non-distended with normoactive bowel sounds. No hepatosplenomegaly. Abdominal aorta is normal size without bruit Extremities: No edema, no clubbing, no cyanosis, no ulcers,  Peripheral: 2+ radial, 2+ femoral, 2+ dorsal pedal pulses Neuro: Alert and oriented. Moves all extremities spontaneously. Psych:  Responds to questions appropriately with a normal  affect.   Intake/Output Summary (Last 24 hours) at 11/14/2018 1242 Last data filed at 11/13/2018 1820 Gross per 24 hour  Intake 240 ml  Output -  Net 240 ml    Inpatient Medications:  .  stroke: mapping our early stages of recovery book   Does not apply Once  . aspirin EC  81 mg Oral Daily  . atorvastatin  40 mg Oral q1800  . clopidogrel  75 mg Oral Daily  . enoxaparin (LOVENOX) injection  40 mg Subcutaneous Q24H  . etodolac  400 mg Oral BID  . galantamine  16 mg Oral Daily  . levothyroxine  100 mcg Oral Daily  . multivitamin with minerals  1 tablet Oral Daily  . vitamin B-12  1,000 mcg Oral Daily   Infusions:   Labs: Recent Labs    11/11/18 1633  NA 139  K 4.0  CL 108  CO2 23  GLUCOSE 96  BUN 28*  CREATININE 1.01  CALCIUM 8.9   Recent Labs    11/11/18 1633  AST 30  ALT 20  ALKPHOS 67  BILITOT 0.7  PROT 6.4*  ALBUMIN 3.9   Recent Labs    11/11/18 1633  WBC 5.6  NEUTROABS 3.2  HGB 13.5  HCT 40.2  MCV 95.3  PLT 154   No results for input(s): CKTOTAL, CKMB, TROPONINI in the last 72 hours. Invalid input(s): POCBNP Recent Labs    11/11/18 1633  HGBA1C 5.5     Weights: Filed Weights   11/11/18 1818 11/14/18 0740  Weight: 86.2 kg 86.2 kg     Radiology/Studies:  Ct Angio Head W Or Wo Contrast  Result Date: 11/11/2018 CLINICAL DATA:  Focal neuro deficit for greater than 6 hours. Diplopia. Slurred speech. Difficulty walking. Right facial droop. EXAM: CT ANGIOGRAPHY HEAD AND NECK TECHNIQUE: Multidetector CT  imaging of the head and neck was performed using the standard protocol during bolus administration of intravenous contrast. Multiplanar CT image reconstructions and MIPs were obtained to evaluate the vascular anatomy. Carotid stenosis measurements (when applicable) are obtained utilizing NASCET criteria, using the distal internal carotid diameter as the denominator. CONTRAST:  73mL OMNIPAQUE IOHEXOL 350 MG/ML SOLN COMPARISON:  None. FINDINGS: CTA NECK  FINDINGS Aortic arch: A 3 vessel arch configuration is present. Minimal atherosclerotic calcifications are present in the aortic arch. There is some tortuosity of the arch without aneurysm. Minimal calcification is present in the origins of the innominate and left common carotid artery without significant stenosis. Right carotid system: The right common carotid artery is mildly tortuous. Minimal calcifications are present at the carotid bifurcation and proximal right ICA without significant stenosis. There is mild tortuosity of the cervical right ICA without significant stenosis. Left carotid system: The left common carotid artery is within normal limits. Atherosclerotic calcifications are present at the left carotid bifurcation and proximal left ICA without a significant stenosis relative to the more distal vessel. Mild tortuosity is present. Vertebral arteries: The left vertebral artery is the dominant vessel. Both vertebral arteries originate from the subclavian arteries. Atherosclerotic calcifications are present at the origin of the left vertebral artery without a significant stenosis relative to the more distal vessel. There is no significant stenosis of either vertebral artery in the neck. Skeleton: Slight anterolisthesis at C4-5 is degenerative. Chronic loss of disc height is present at C5-6 and C6-7. Degenerative changes extend into the thoracic spine. Leftward curvature is present at the cervicothoracic junction. Rightward curvature is centered at T5-6 Other neck: The soft tissues the neck are otherwise unremarkable. Upper chest: Mild ground-glass attenuation likely reflects atelectasis. Review of the MIP images confirms the above findings CTA HEAD FINDINGS Anterior circulation: The internal carotid arteries are within normal limits from the skull base through the ICA termini. The A1 and M1 segments are normal. Anterior communicating artery is patent. MCA bifurcations are intact. The ACA and MCA branch  vessels are within normal limits. Posterior circulation: The vertebral arteries are codominant. PICA origins are visualized and normal. The vertebrobasilar junction is normal. Basilar artery is mildly ectatic. Both posterior cerebral arteries originate from the basilar tip. The PCA branch vessels are within normal limits. Venous sinuses: The dural sinuses are patent. Straight sinus deep cerebral veins are intact. Cortical veins are unremarkable. There is no significant vascular malformation. Anatomic variants: None Review of the MIP images confirms the above findings IMPRESSION: 1. No emergent large vessel occlusion. 2. Tortuosity of the cervical vasculature without significant stenosis. This is nonspecific, but commonly seen in the setting of chronic hypertension. 3. Degenerative changes of the cervical spine. Electronically Signed   By: San Morelle M.D.   On: 11/11/2018 20:02   Ct Angio Neck W Or Wo Contrast  Result Date: 11/11/2018 CLINICAL DATA:  Focal neuro deficit for greater than 6 hours. Diplopia. Slurred speech. Difficulty walking. Right facial droop. EXAM: CT ANGIOGRAPHY HEAD AND NECK TECHNIQUE: Multidetector CT imaging of the head and neck was performed using the standard protocol during bolus administration of intravenous contrast. Multiplanar CT image reconstructions and MIPs were obtained to evaluate the vascular anatomy. Carotid stenosis measurements (when applicable) are obtained utilizing NASCET criteria, using the distal internal carotid diameter as the denominator. CONTRAST:  95mL OMNIPAQUE IOHEXOL 350 MG/ML SOLN COMPARISON:  None. FINDINGS: CTA NECK FINDINGS Aortic arch: A 3 vessel arch configuration is present. Minimal atherosclerotic calcifications are present in  the aortic arch. There is some tortuosity of the arch without aneurysm. Minimal calcification is present in the origins of the innominate and left common carotid artery without significant stenosis. Right carotid system:  The right common carotid artery is mildly tortuous. Minimal calcifications are present at the carotid bifurcation and proximal right ICA without significant stenosis. There is mild tortuosity of the cervical right ICA without significant stenosis. Left carotid system: The left common carotid artery is within normal limits. Atherosclerotic calcifications are present at the left carotid bifurcation and proximal left ICA without a significant stenosis relative to the more distal vessel. Mild tortuosity is present. Vertebral arteries: The left vertebral artery is the dominant vessel. Both vertebral arteries originate from the subclavian arteries. Atherosclerotic calcifications are present at the origin of the left vertebral artery without a significant stenosis relative to the more distal vessel. There is no significant stenosis of either vertebral artery in the neck. Skeleton: Slight anterolisthesis at C4-5 is degenerative. Chronic loss of disc height is present at C5-6 and C6-7. Degenerative changes extend into the thoracic spine. Leftward curvature is present at the cervicothoracic junction. Rightward curvature is centered at T5-6 Other neck: The soft tissues the neck are otherwise unremarkable. Upper chest: Mild ground-glass attenuation likely reflects atelectasis. Review of the MIP images confirms the above findings CTA HEAD FINDINGS Anterior circulation: The internal carotid arteries are within normal limits from the skull base through the ICA termini. The A1 and M1 segments are normal. Anterior communicating artery is patent. MCA bifurcations are intact. The ACA and MCA branch vessels are within normal limits. Posterior circulation: The vertebral arteries are codominant. PICA origins are visualized and normal. The vertebrobasilar junction is normal. Basilar artery is mildly ectatic. Both posterior cerebral arteries originate from the basilar tip. The PCA branch vessels are within normal limits. Venous sinuses:  The dural sinuses are patent. Straight sinus deep cerebral veins are intact. Cortical veins are unremarkable. There is no significant vascular malformation. Anatomic variants: None Review of the MIP images confirms the above findings IMPRESSION: 1. No emergent large vessel occlusion. 2. Tortuosity of the cervical vasculature without significant stenosis. This is nonspecific, but commonly seen in the setting of chronic hypertension. 3. Degenerative changes of the cervical spine. Electronically Signed   By: San Morelle M.D.   On: 11/11/2018 20:02   Mr Brain Wo Contrast  Result Date: 11/11/2018 CLINICAL DATA:  Slurred speech. Difficulty walking. Right facial droop. EXAM: MRI HEAD WITHOUT CONTRAST TECHNIQUE: Multiplanar, multiecho pulse sequences of the brain and surrounding structures were obtained without intravenous contrast. COMPARISON:  CTA head and neck and CT head without contrast 11/11/2018 FINDINGS: Brain: The diffusion-weighted images demonstrate acute nonhemorrhagic infarct involving the right anterior thalamus measuring up to 8 mm. Linear 6 mm infarct is present in the posterior left temporal lobe on image 25 series 5. A 10 mm left occipital cortical infarct is present. A punctate subcortical right occipital infarct is present. A 3 mm acute nonhemorrhagic white matter infarct is present in the right parietal lobe. Confluent periventricular and subcortical T2 hyperintensities are present bilaterally. There are remote lacunar infarcts involving the basal ganglia bilaterally. White matter changes extend into the brainstem. Remote lacunar infarcts are present in the cerebellum bilaterally. Vascular: Flow is present in the major intracranial arteries. Skull and upper cervical spine: The craniocervical junction is normal. Upper cervical spine is within normal limits. Marrow signal is unremarkable. Sinuses/Orbits: Fluid is present in the right sphenoid sinus. Mucosal disease is present in the  maxillary  sinuses and anterior ethmoid air cells bilaterally. The mastoid air cells are clear. The globes and orbits are within normal limits. IMPRESSION: 1. 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 2. 8 mm acute nonhemorrhagic infarct in the anterior right thalamus may involve the internal capsule 3. Acute infarct in the left occipital lobe. 4. Small acute infarcts in the posterior left temporal lobe, right occipital lobe, and high right parietal white matter. 5. 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 6. Sinus disease as described. Electronically Signed   By: San Morelle M.D.   On: 11/11/2018 21:39   Ct Head Code Stroke Wo Contrast  Result Date: 11/11/2018 CLINICAL DATA:  Code stroke. New onset of diplopia in slurred speech. Right facial droop. Last known well 3 hours ago. EXAM: CT HEAD WITHOUT CONTRAST TECHNIQUE: Contiguous axial images were obtained from the base of the skull through the vertex without intravenous contrast. COMPARISON:  CT head without contrast 05/17/2017. FINDINGS: Brain: Moderate diffuse white matter changes are stable. No acute cortical infarct is present. Basal ganglia are unchanged. No acute hemorrhage or mass lesion is present. No significant extraaxial fluid collection is present. The brainstem and cerebellum are within normal limits. Vascular: Moderate ectasia is present throughout the circle-of-Willis. Atherosclerotic calcifications are present within the cavernous internal carotid arteries and at the dural margin of the vertebral arteries. There is no asymmetric hyperdense vessel. Skull: Calvarium is intact. No focal lytic or blastic lesions are present. 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 Is chronic wall thickening. Bilateral anterior ethmoid mucosal thickening is present. This  extends into the inferior left frontal sinus. There is fluid in the right sphenoid sinus. Mastoid air cells are clear. The globes and orbits are within normal limits. ASPECTS [facility] Stroke Program Early CT Score) - Ganglionic level infarction (caudate, lentiform nuclei, internal capsule, insula, M1-M3 cortex): 7/7 - Supraganglionic infarction (M4-M6 cortex): 3/3 Total score (0-10 with 10 being normal): 10/10 IMPRESSION: 1. No acute intracranial abnormality or significant interval change. 2. Stable moderate diffuse white matter disease. This likely reflects the sequela of chronic microvascular ischemia. 3. Intracranial atherosclerosis moderate ectasia throughout the circle-of-Willis. 4. ASPECTS is 10/10 These results were called by telephone at the time of interpretation on 11/11/2018 at 4:17 pm to [facility] , who verbally acknowledged these results. Electronically Signed   By: San Morelle M.D.   On: 11/11/2018 16:19     Assessment and Recommendation  73 y.o. male with bicuspid aortic valve significant heart murmur stroke and dilated a sending aorta 1.  Continue high intensity cholesterol therapy aspirin and Plavix for further risk reduction in stroke 2.  CT angiogram of a sending aorta to assess size of aorta and need for further intervention 3.  Mild aortic valve stenosis with a bicuspid aortic valve with a small friability and possible source of embolism if consistent with stroke 4.  Further intervention and treatment options after above  Signed, Serafina Royals M.D. FACC

## 2018-11-14 NOTE — TOC Transition Note (Signed)
Transition of Care [facility]) - CM/SW Discharge Note   Patient Details  Name: Ronald Adkins MRN: [id number] Date of Birth: 1945-10-10  Transition of Care [facility]) CM/SW Contact:  Candie Chroman, LCSW Phone Number: 11/14/2018, 10:39 AM   Clinical Narrative: Patient has orders to discharge home today. [employer] representative is aware. Home health orders include speech therapy. [facility] said they have a hold on speech right now but will start services with patient as soon as they are able. No further concerns. CSW signing off.     Final next level of care: [place] Barriers to Discharge: Barriers Resolved   Patient Goals and CMS Choice Patient states their goals for this hospitalization and ongoing recovery are:: Ready to go home CMS Medicare.gov Compare Post Acute Care list provided to:: Patient Choice offered to / list presented to : Patient  Discharge Placement                    Patient and family notified of of transfer: 11/14/18  Discharge Plan and Services   Discharge Planning Services: CM Consult Post Acute Care Choice: Home Health          DME Arranged: N/A         HH Arranged: RN, PT, OT, Speech Therapy HH Agency: [facility] Date [place]: 11/14/18 Time [place]: 1210 Representative spoke with at [place]: [place]bird Smith ([place]) Interventions     Readmission Risk Interventions No flowsheet data found.

## 2018-11-19 DIAGNOSIS — I712 Thoracic aortic aneurysm, without rupture: Secondary | ICD-10-CM | POA: Diagnosis not present

## 2018-11-19 DIAGNOSIS — I63412 Cerebral infarction due to embolism of left middle cerebral artery: Secondary | ICD-10-CM | POA: Diagnosis not present

## 2018-11-19 DIAGNOSIS — H532 Diplopia: Secondary | ICD-10-CM | POA: Diagnosis not present

## 2018-11-22 DIAGNOSIS — K635 Polyp of colon: Secondary | ICD-10-CM | POA: Diagnosis not present

## 2018-11-22 DIAGNOSIS — M5030 Other cervical disc degeneration, unspecified cervical region: Secondary | ICD-10-CM | POA: Diagnosis not present

## 2018-11-22 DIAGNOSIS — Z9181 History of falling: Secondary | ICD-10-CM | POA: Diagnosis not present

## 2018-11-22 DIAGNOSIS — Q230 Congenital stenosis of aortic valve: Secondary | ICD-10-CM | POA: Diagnosis not present

## 2018-11-22 DIAGNOSIS — E782 Mixed hyperlipidemia: Secondary | ICD-10-CM | POA: Diagnosis not present

## 2018-11-22 DIAGNOSIS — F0150 Vascular dementia without behavioral disturbance: Secondary | ICD-10-CM | POA: Diagnosis not present

## 2018-11-22 DIAGNOSIS — K219 Gastro-esophageal reflux disease without esophagitis: Secondary | ICD-10-CM | POA: Diagnosis not present

## 2018-11-22 DIAGNOSIS — I63412 Cerebral infarction due to embolism of left middle cerebral artery: Secondary | ICD-10-CM | POA: Diagnosis not present

## 2018-11-22 DIAGNOSIS — Z8546 Personal history of malignant neoplasm of prostate: Secondary | ICD-10-CM | POA: Diagnosis not present

## 2018-11-22 DIAGNOSIS — H532 Diplopia: Secondary | ICD-10-CM | POA: Diagnosis not present

## 2018-11-22 DIAGNOSIS — Q231 Congenital insufficiency of aortic valve: Secondary | ICD-10-CM | POA: Diagnosis not present

## 2018-11-22 DIAGNOSIS — I69398 Other sequelae of cerebral infarction: Secondary | ICD-10-CM | POA: Diagnosis not present

## 2018-11-22 DIAGNOSIS — M419 Scoliosis, unspecified: Secondary | ICD-10-CM | POA: Diagnosis not present

## 2018-11-22 DIAGNOSIS — Z7901 Long term (current) use of anticoagulants: Secondary | ICD-10-CM | POA: Diagnosis not present

## 2018-11-22 DIAGNOSIS — M1990 Unspecified osteoarthritis, unspecified site: Secondary | ICD-10-CM | POA: Diagnosis not present

## 2018-11-22 DIAGNOSIS — F17210 Nicotine dependence, cigarettes, uncomplicated: Secondary | ICD-10-CM | POA: Diagnosis not present

## 2018-11-22 DIAGNOSIS — I69322 Dysarthria following cerebral infarction: Secondary | ICD-10-CM | POA: Diagnosis not present

## 2018-11-22 DIAGNOSIS — I712 Thoracic aortic aneurysm, without rupture: Secondary | ICD-10-CM | POA: Diagnosis not present

## 2018-11-22 DIAGNOSIS — E039 Hypothyroidism, unspecified: Secondary | ICD-10-CM | POA: Diagnosis not present

## 2018-11-22 DIAGNOSIS — M5134 Other intervertebral disc degeneration, thoracic region: Secondary | ICD-10-CM | POA: Diagnosis not present

## 2018-11-27 DIAGNOSIS — M5134 Other intervertebral disc degeneration, thoracic region: Secondary | ICD-10-CM | POA: Diagnosis not present

## 2018-11-27 DIAGNOSIS — H532 Diplopia: Secondary | ICD-10-CM | POA: Diagnosis not present

## 2018-11-27 DIAGNOSIS — I69398 Other sequelae of cerebral infarction: Secondary | ICD-10-CM | POA: Diagnosis not present

## 2018-11-27 DIAGNOSIS — M5030 Other cervical disc degeneration, unspecified cervical region: Secondary | ICD-10-CM | POA: Diagnosis not present

## 2018-11-27 DIAGNOSIS — F0150 Vascular dementia without behavioral disturbance: Secondary | ICD-10-CM | POA: Diagnosis not present

## 2018-11-27 DIAGNOSIS — I69322 Dysarthria following cerebral infarction: Secondary | ICD-10-CM | POA: Diagnosis not present

## 2018-11-27 DIAGNOSIS — M419 Scoliosis, unspecified: Secondary | ICD-10-CM | POA: Diagnosis not present

## 2018-12-03 DIAGNOSIS — H532 Diplopia: Secondary | ICD-10-CM | POA: Diagnosis not present

## 2018-12-03 DIAGNOSIS — F17210 Nicotine dependence, cigarettes, uncomplicated: Secondary | ICD-10-CM | POA: Diagnosis not present

## 2018-12-03 DIAGNOSIS — M419 Scoliosis, unspecified: Secondary | ICD-10-CM | POA: Diagnosis not present

## 2018-12-03 DIAGNOSIS — M5134 Other intervertebral disc degeneration, thoracic region: Secondary | ICD-10-CM | POA: Diagnosis not present

## 2018-12-03 DIAGNOSIS — Z7901 Long term (current) use of anticoagulants: Secondary | ICD-10-CM | POA: Diagnosis not present

## 2018-12-03 DIAGNOSIS — I69322 Dysarthria following cerebral infarction: Secondary | ICD-10-CM | POA: Diagnosis not present

## 2018-12-03 DIAGNOSIS — K635 Polyp of colon: Secondary | ICD-10-CM | POA: Diagnosis not present

## 2018-12-03 DIAGNOSIS — F0150 Vascular dementia without behavioral disturbance: Secondary | ICD-10-CM | POA: Diagnosis not present

## 2018-12-03 DIAGNOSIS — Z8546 Personal history of malignant neoplasm of prostate: Secondary | ICD-10-CM | POA: Diagnosis not present

## 2018-12-03 DIAGNOSIS — I712 Thoracic aortic aneurysm, without rupture: Secondary | ICD-10-CM | POA: Diagnosis not present

## 2018-12-03 DIAGNOSIS — E039 Hypothyroidism, unspecified: Secondary | ICD-10-CM | POA: Diagnosis not present

## 2018-12-03 DIAGNOSIS — K219 Gastro-esophageal reflux disease without esophagitis: Secondary | ICD-10-CM | POA: Diagnosis not present

## 2018-12-03 DIAGNOSIS — M1990 Unspecified osteoarthritis, unspecified site: Secondary | ICD-10-CM | POA: Diagnosis not present

## 2018-12-03 DIAGNOSIS — M5030 Other cervical disc degeneration, unspecified cervical region: Secondary | ICD-10-CM | POA: Diagnosis not present

## 2018-12-03 DIAGNOSIS — I69398 Other sequelae of cerebral infarction: Secondary | ICD-10-CM | POA: Diagnosis not present

## 2018-12-03 DIAGNOSIS — Z9181 History of falling: Secondary | ICD-10-CM | POA: Diagnosis not present

## 2018-12-07 DIAGNOSIS — I712 Thoracic aortic aneurysm, without rupture: Secondary | ICD-10-CM | POA: Diagnosis not present

## 2018-12-07 DIAGNOSIS — M1990 Unspecified osteoarthritis, unspecified site: Secondary | ICD-10-CM | POA: Diagnosis not present

## 2018-12-07 DIAGNOSIS — M5134 Other intervertebral disc degeneration, thoracic region: Secondary | ICD-10-CM | POA: Diagnosis not present

## 2018-12-07 DIAGNOSIS — E039 Hypothyroidism, unspecified: Secondary | ICD-10-CM | POA: Diagnosis not present

## 2018-12-07 DIAGNOSIS — Z8546 Personal history of malignant neoplasm of prostate: Secondary | ICD-10-CM | POA: Diagnosis not present

## 2018-12-07 DIAGNOSIS — I69322 Dysarthria following cerebral infarction: Secondary | ICD-10-CM | POA: Diagnosis not present

## 2018-12-07 DIAGNOSIS — M419 Scoliosis, unspecified: Secondary | ICD-10-CM | POA: Diagnosis not present

## 2018-12-07 DIAGNOSIS — Z9181 History of falling: Secondary | ICD-10-CM | POA: Diagnosis not present

## 2018-12-07 DIAGNOSIS — H532 Diplopia: Secondary | ICD-10-CM | POA: Diagnosis not present

## 2018-12-07 DIAGNOSIS — K219 Gastro-esophageal reflux disease without esophagitis: Secondary | ICD-10-CM | POA: Diagnosis not present

## 2018-12-07 DIAGNOSIS — M5030 Other cervical disc degeneration, unspecified cervical region: Secondary | ICD-10-CM | POA: Diagnosis not present

## 2018-12-07 DIAGNOSIS — I69398 Other sequelae of cerebral infarction: Secondary | ICD-10-CM | POA: Diagnosis not present

## 2018-12-07 DIAGNOSIS — F0150 Vascular dementia without behavioral disturbance: Secondary | ICD-10-CM | POA: Diagnosis not present

## 2018-12-07 DIAGNOSIS — Z7901 Long term (current) use of anticoagulants: Secondary | ICD-10-CM | POA: Diagnosis not present

## 2018-12-07 DIAGNOSIS — K635 Polyp of colon: Secondary | ICD-10-CM | POA: Diagnosis not present

## 2018-12-07 DIAGNOSIS — F17210 Nicotine dependence, cigarettes, uncomplicated: Secondary | ICD-10-CM | POA: Diagnosis not present

## 2018-12-13 DIAGNOSIS — F17210 Nicotine dependence, cigarettes, uncomplicated: Secondary | ICD-10-CM | POA: Diagnosis not present

## 2018-12-13 DIAGNOSIS — Z9181 History of falling: Secondary | ICD-10-CM | POA: Diagnosis not present

## 2018-12-13 DIAGNOSIS — M419 Scoliosis, unspecified: Secondary | ICD-10-CM | POA: Diagnosis not present

## 2018-12-13 DIAGNOSIS — E039 Hypothyroidism, unspecified: Secondary | ICD-10-CM | POA: Diagnosis not present

## 2018-12-13 DIAGNOSIS — I69322 Dysarthria following cerebral infarction: Secondary | ICD-10-CM | POA: Diagnosis not present

## 2018-12-13 DIAGNOSIS — K635 Polyp of colon: Secondary | ICD-10-CM | POA: Diagnosis not present

## 2018-12-13 DIAGNOSIS — H532 Diplopia: Secondary | ICD-10-CM | POA: Diagnosis not present

## 2018-12-13 DIAGNOSIS — I69398 Other sequelae of cerebral infarction: Secondary | ICD-10-CM | POA: Diagnosis not present

## 2018-12-13 DIAGNOSIS — I712 Thoracic aortic aneurysm, without rupture: Secondary | ICD-10-CM | POA: Diagnosis not present

## 2018-12-13 DIAGNOSIS — M1990 Unspecified osteoarthritis, unspecified site: Secondary | ICD-10-CM | POA: Diagnosis not present

## 2018-12-13 DIAGNOSIS — F0150 Vascular dementia without behavioral disturbance: Secondary | ICD-10-CM | POA: Diagnosis not present

## 2018-12-13 DIAGNOSIS — M5030 Other cervical disc degeneration, unspecified cervical region: Secondary | ICD-10-CM | POA: Diagnosis not present

## 2018-12-13 DIAGNOSIS — Z7901 Long term (current) use of anticoagulants: Secondary | ICD-10-CM | POA: Diagnosis not present

## 2018-12-13 DIAGNOSIS — M5134 Other intervertebral disc degeneration, thoracic region: Secondary | ICD-10-CM | POA: Diagnosis not present

## 2018-12-13 DIAGNOSIS — Z8546 Personal history of malignant neoplasm of prostate: Secondary | ICD-10-CM | POA: Diagnosis not present

## 2018-12-13 DIAGNOSIS — K219 Gastro-esophageal reflux disease without esophagitis: Secondary | ICD-10-CM | POA: Diagnosis not present

## 2018-12-17 DIAGNOSIS — I712 Thoracic aortic aneurysm, without rupture: Secondary | ICD-10-CM | POA: Diagnosis not present

## 2018-12-17 DIAGNOSIS — I63412 Cerebral infarction due to embolism of left middle cerebral artery: Secondary | ICD-10-CM | POA: Diagnosis not present

## 2018-12-19 DIAGNOSIS — M5134 Other intervertebral disc degeneration, thoracic region: Secondary | ICD-10-CM | POA: Diagnosis not present

## 2018-12-19 DIAGNOSIS — I712 Thoracic aortic aneurysm, without rupture: Secondary | ICD-10-CM | POA: Diagnosis not present

## 2018-12-19 DIAGNOSIS — M1990 Unspecified osteoarthritis, unspecified site: Secondary | ICD-10-CM | POA: Diagnosis not present

## 2018-12-19 DIAGNOSIS — H532 Diplopia: Secondary | ICD-10-CM | POA: Diagnosis not present

## 2018-12-19 DIAGNOSIS — Z7901 Long term (current) use of anticoagulants: Secondary | ICD-10-CM | POA: Diagnosis not present

## 2018-12-19 DIAGNOSIS — K219 Gastro-esophageal reflux disease without esophagitis: Secondary | ICD-10-CM | POA: Diagnosis not present

## 2018-12-19 DIAGNOSIS — E039 Hypothyroidism, unspecified: Secondary | ICD-10-CM | POA: Diagnosis not present

## 2018-12-19 DIAGNOSIS — I69322 Dysarthria following cerebral infarction: Secondary | ICD-10-CM | POA: Diagnosis not present

## 2018-12-19 DIAGNOSIS — F17210 Nicotine dependence, cigarettes, uncomplicated: Secondary | ICD-10-CM | POA: Diagnosis not present

## 2018-12-19 DIAGNOSIS — I69398 Other sequelae of cerebral infarction: Secondary | ICD-10-CM | POA: Diagnosis not present

## 2018-12-19 DIAGNOSIS — M419 Scoliosis, unspecified: Secondary | ICD-10-CM | POA: Diagnosis not present

## 2018-12-19 DIAGNOSIS — Z8546 Personal history of malignant neoplasm of prostate: Secondary | ICD-10-CM | POA: Diagnosis not present

## 2018-12-19 DIAGNOSIS — F0150 Vascular dementia without behavioral disturbance: Secondary | ICD-10-CM | POA: Diagnosis not present

## 2018-12-19 DIAGNOSIS — K635 Polyp of colon: Secondary | ICD-10-CM | POA: Diagnosis not present

## 2018-12-19 DIAGNOSIS — M5030 Other cervical disc degeneration, unspecified cervical region: Secondary | ICD-10-CM | POA: Diagnosis not present

## 2018-12-19 DIAGNOSIS — Z9181 History of falling: Secondary | ICD-10-CM | POA: Diagnosis not present

## 2019-01-15 DIAGNOSIS — I63532 Cerebral infarction due to unspecified occlusion or stenosis of left posterior cerebral artery: Secondary | ICD-10-CM | POA: Diagnosis not present

## 2019-02-04 DIAGNOSIS — L570 Actinic keratosis: Secondary | ICD-10-CM | POA: Diagnosis not present

## 2019-02-04 DIAGNOSIS — L720 Epidermal cyst: Secondary | ICD-10-CM | POA: Diagnosis not present

## 2019-03-05 DIAGNOSIS — R05 Cough: Secondary | ICD-10-CM | POA: Diagnosis not present

## 2019-03-05 DIAGNOSIS — J019 Acute sinusitis, unspecified: Secondary | ICD-10-CM | POA: Diagnosis not present

## 2019-03-05 DIAGNOSIS — Z20828 Contact with and (suspected) exposure to other viral communicable diseases: Secondary | ICD-10-CM | POA: Diagnosis not present

## 2019-03-25 DIAGNOSIS — H5213 Myopia, bilateral: Secondary | ICD-10-CM | POA: Diagnosis not present

## 2019-03-25 DIAGNOSIS — I679 Cerebrovascular disease, unspecified: Secondary | ICD-10-CM | POA: Diagnosis not present

## 2019-03-25 DIAGNOSIS — E782 Mixed hyperlipidemia: Secondary | ICD-10-CM | POA: Diagnosis not present

## 2019-03-25 DIAGNOSIS — I712 Thoracic aortic aneurysm, without rupture: Secondary | ICD-10-CM | POA: Diagnosis not present

## 2019-03-25 DIAGNOSIS — Q230 Congenital stenosis of aortic valve: Secondary | ICD-10-CM | POA: Diagnosis not present

## 2019-03-25 DIAGNOSIS — Q231 Congenital insufficiency of aortic valve: Secondary | ICD-10-CM | POA: Diagnosis not present

## 2019-03-25 DIAGNOSIS — H35363 Drusen (degenerative) of macula, bilateral: Secondary | ICD-10-CM | POA: Diagnosis not present

## 2019-03-25 DIAGNOSIS — H43813 Vitreous degeneration, bilateral: Secondary | ICD-10-CM | POA: Diagnosis not present

## 2019-03-25 DIAGNOSIS — H2513 Age-related nuclear cataract, bilateral: Secondary | ICD-10-CM | POA: Diagnosis not present

## 2019-04-18 DIAGNOSIS — M65341 Trigger finger, right ring finger: Secondary | ICD-10-CM | POA: Diagnosis not present

## 2019-04-18 DIAGNOSIS — M65342 Trigger finger, left ring finger: Secondary | ICD-10-CM | POA: Diagnosis not present

## 2019-04-19 ENCOUNTER — Ambulatory Visit: Payer: PPO | Attending: Internal Medicine

## 2019-04-19 DIAGNOSIS — Z23 Encounter for immunization: Secondary | ICD-10-CM | POA: Insufficient documentation

## 2019-04-19 NOTE — Progress Notes (Signed)
   Covid-19 Vaccination Clinic  Name:  KALEV FALKE    MRN: [id number] DOB: [birth year]  04/19/2019  Mr. Pardee was observed post Covid-19 immunization for 15 minutes without incidence. He was provided with Vaccine Information Sheet and instruction to access the V-Safe system.   Mr. Rozell was instructed to call 911 with any severe reactions post vaccine: Marland Kitchen Difficulty breathing  . Swelling of your face and throat  . A fast heartbeat  . A bad rash all over your body  . Dizziness and weakness    Immunizations Administered    Name Date Dose VIS Date Route   Pfizer COVID-19 Vaccine 04/19/2019  9:54 AM 0.3 mL 02/15/2019 Intramuscular   Manufacturer: [place]   Lot: [id number]   [place]: [id number]

## 2019-04-22 DIAGNOSIS — Z125 Encounter for screening for malignant neoplasm of prostate: Secondary | ICD-10-CM | POA: Diagnosis not present

## 2019-04-22 DIAGNOSIS — E039 Hypothyroidism, unspecified: Secondary | ICD-10-CM | POA: Diagnosis not present

## 2019-04-29 DIAGNOSIS — Z0000 Encounter for general adult medical examination without abnormal findings: Secondary | ICD-10-CM | POA: Diagnosis not present

## 2019-04-29 DIAGNOSIS — C61 Malignant neoplasm of prostate: Secondary | ICD-10-CM | POA: Diagnosis not present

## 2019-04-29 DIAGNOSIS — I712 Thoracic aortic aneurysm, without rupture: Secondary | ICD-10-CM | POA: Diagnosis not present

## 2019-04-29 DIAGNOSIS — I63412 Cerebral infarction due to embolism of left middle cerebral artery: Secondary | ICD-10-CM | POA: Diagnosis not present

## 2019-05-15 ENCOUNTER — Ambulatory Visit: Payer: PPO | Attending: Internal Medicine

## 2019-05-15 DIAGNOSIS — Z23 Encounter for immunization: Secondary | ICD-10-CM | POA: Insufficient documentation

## 2019-05-15 NOTE — Progress Notes (Signed)
   Covid-19 Vaccination Clinic  Name:  [name]    MRN: [id number] DOB: [birth year]  05/15/2019  Ronald Adkins was observed post Covid-19 immunization for 15 minutes without incident. He was provided with Vaccine Information Sheet and instruction to access the V-Safe system.   [name] was instructed to call 911 with any severe reactions post vaccine: Marland Kitchen Difficulty breathing  . Swelling of face and throat  . A fast heartbeat  . A bad rash all over body  . Dizziness and weakness   Immunizations Administered    Name Date Dose VIS Date Route   Pfizer COVID-19 Vaccine 05/15/2019  8:36 AM 0.3 mL 02/15/2019 Intramuscular   Manufacturer: [place]   Lot: [id number]   [place]: [id number]

## 2019-06-25 DIAGNOSIS — Q230 Congenital stenosis of aortic valve: Secondary | ICD-10-CM | POA: Diagnosis not present

## 2019-06-25 DIAGNOSIS — I63412 Cerebral infarction due to embolism of left middle cerebral artery: Secondary | ICD-10-CM | POA: Diagnosis not present

## 2019-06-25 DIAGNOSIS — E782 Mixed hyperlipidemia: Secondary | ICD-10-CM | POA: Diagnosis not present

## 2019-06-25 DIAGNOSIS — Q231 Congenital insufficiency of aortic valve: Secondary | ICD-10-CM | POA: Diagnosis not present

## 2019-06-25 DIAGNOSIS — I712 Thoracic aortic aneurysm, without rupture: Secondary | ICD-10-CM | POA: Diagnosis not present

## 2019-07-02 DIAGNOSIS — M65341 Trigger finger, right ring finger: Secondary | ICD-10-CM | POA: Diagnosis not present

## 2019-07-03 DIAGNOSIS — Z01818 Encounter for other preprocedural examination: Secondary | ICD-10-CM | POA: Diagnosis not present

## 2019-07-03 DIAGNOSIS — Z20822 Contact with and (suspected) exposure to covid-19: Secondary | ICD-10-CM | POA: Diagnosis not present

## 2019-07-03 DIAGNOSIS — Z01812 Encounter for preprocedural laboratory examination: Secondary | ICD-10-CM | POA: Diagnosis not present

## 2019-07-05 DIAGNOSIS — F0390 Unspecified dementia without behavioral disturbance: Secondary | ICD-10-CM | POA: Diagnosis not present

## 2019-07-05 DIAGNOSIS — M65341 Trigger finger, right ring finger: Secondary | ICD-10-CM | POA: Diagnosis not present

## 2019-07-05 DIAGNOSIS — Z8673 Personal history of transient ischemic attack (TIA), and cerebral infarction without residual deficits: Secondary | ICD-10-CM | POA: Diagnosis not present

## 2019-07-05 DIAGNOSIS — Z87891 Personal history of nicotine dependence: Secondary | ICD-10-CM | POA: Diagnosis not present

## 2019-07-05 DIAGNOSIS — I350 Nonrheumatic aortic (valve) stenosis: Secondary | ICD-10-CM | POA: Diagnosis not present

## 2019-07-05 DIAGNOSIS — Z79890 Hormone replacement therapy (postmenopausal): Secondary | ICD-10-CM | POA: Diagnosis not present

## 2019-07-05 DIAGNOSIS — Z7901 Long term (current) use of anticoagulants: Secondary | ICD-10-CM | POA: Diagnosis not present

## 2019-07-05 DIAGNOSIS — E785 Hyperlipidemia, unspecified: Secondary | ICD-10-CM | POA: Diagnosis not present

## 2019-07-15 DIAGNOSIS — I63532 Cerebral infarction due to unspecified occlusion or stenosis of left posterior cerebral artery: Secondary | ICD-10-CM | POA: Diagnosis not present

## 2019-07-15 DIAGNOSIS — R4189 Other symptoms and signs involving cognitive functions and awareness: Secondary | ICD-10-CM | POA: Diagnosis not present

## 2019-07-15 DIAGNOSIS — R5382 Chronic fatigue, unspecified: Secondary | ICD-10-CM | POA: Diagnosis not present

## 2019-07-15 DIAGNOSIS — R5381 Other malaise: Secondary | ICD-10-CM | POA: Diagnosis not present

## 2019-07-16 ENCOUNTER — Other Ambulatory Visit: Payer: Self-pay | Admitting: Neurology

## 2019-07-16 DIAGNOSIS — I639 Cerebral infarction, unspecified: Secondary | ICD-10-CM

## 2019-07-29 ENCOUNTER — Other Ambulatory Visit: Payer: Self-pay

## 2019-07-29 ENCOUNTER — Ambulatory Visit
Admission: RE | Admit: 2019-07-29 | Discharge: 2019-07-29 | Disposition: A | Discharge status: Home / Self Care | Payer: PPO | Source: Ambulatory Visit | Attending: Neurology | Admitting: Neurology

## 2019-07-29 DIAGNOSIS — I6782 Cerebral ischemia: Secondary | ICD-10-CM | POA: Diagnosis not present

## 2019-07-29 DIAGNOSIS — I639 Cerebral infarction, unspecified: Secondary | ICD-10-CM | POA: Insufficient documentation

## 2019-08-13 DIAGNOSIS — I63532 Cerebral infarction due to unspecified occlusion or stenosis of left posterior cerebral artery: Secondary | ICD-10-CM | POA: Diagnosis not present

## 2019-08-13 DIAGNOSIS — R5382 Chronic fatigue, unspecified: Secondary | ICD-10-CM | POA: Diagnosis not present

## 2019-08-13 DIAGNOSIS — R4189 Other symptoms and signs involving cognitive functions and awareness: Secondary | ICD-10-CM | POA: Diagnosis not present

## 2019-09-11 DIAGNOSIS — Z8673 Personal history of transient ischemic attack (TIA), and cerebral infarction without residual deficits: Secondary | ICD-10-CM | POA: Diagnosis not present

## 2019-09-11 DIAGNOSIS — F329 Major depressive disorder, single episode, unspecified: Secondary | ICD-10-CM | POA: Diagnosis not present

## 2019-09-11 DIAGNOSIS — M65342 Trigger finger, left ring finger: Secondary | ICD-10-CM | POA: Diagnosis not present

## 2019-09-11 DIAGNOSIS — F419 Anxiety disorder, unspecified: Secondary | ICD-10-CM | POA: Diagnosis not present

## 2019-09-11 DIAGNOSIS — I10 Essential (primary) hypertension: Secondary | ICD-10-CM | POA: Diagnosis not present

## 2019-09-11 DIAGNOSIS — Z96649 Presence of unspecified artificial hip joint: Secondary | ICD-10-CM | POA: Diagnosis not present

## 2019-09-11 DIAGNOSIS — E785 Hyperlipidemia, unspecified: Secondary | ICD-10-CM | POA: Diagnosis not present

## 2019-09-11 DIAGNOSIS — M65352 Trigger finger, left little finger: Secondary | ICD-10-CM | POA: Diagnosis not present

## 2019-09-11 DIAGNOSIS — Z87891 Personal history of nicotine dependence: Secondary | ICD-10-CM | POA: Diagnosis not present

## 2019-10-21 DIAGNOSIS — I63412 Cerebral infarction due to embolism of left middle cerebral artery: Secondary | ICD-10-CM | POA: Diagnosis not present

## 2019-10-21 DIAGNOSIS — E039 Hypothyroidism, unspecified: Secondary | ICD-10-CM | POA: Diagnosis not present

## 2019-10-24 DIAGNOSIS — C61 Malignant neoplasm of prostate: Secondary | ICD-10-CM | POA: Diagnosis not present

## 2019-10-24 DIAGNOSIS — Z125 Encounter for screening for malignant neoplasm of prostate: Secondary | ICD-10-CM | POA: Diagnosis not present

## 2019-10-24 DIAGNOSIS — I712 Thoracic aortic aneurysm, without rupture: Secondary | ICD-10-CM | POA: Diagnosis not present

## 2019-10-24 DIAGNOSIS — Q231 Congenital insufficiency of aortic valve: Secondary | ICD-10-CM | POA: Diagnosis not present

## 2019-10-24 DIAGNOSIS — E782 Mixed hyperlipidemia: Secondary | ICD-10-CM | POA: Diagnosis not present

## 2019-10-24 DIAGNOSIS — Q230 Congenital stenosis of aortic valve: Secondary | ICD-10-CM | POA: Diagnosis not present

## 2019-12-12 DIAGNOSIS — Z859 Personal history of malignant neoplasm, unspecified: Secondary | ICD-10-CM | POA: Diagnosis not present

## 2019-12-12 DIAGNOSIS — L821 Other seborrheic keratosis: Secondary | ICD-10-CM | POA: Diagnosis not present

## 2019-12-12 DIAGNOSIS — L578 Other skin changes due to chronic exposure to nonionizing radiation: Secondary | ICD-10-CM | POA: Diagnosis not present

## 2019-12-12 DIAGNOSIS — Z872 Personal history of diseases of the skin and subcutaneous tissue: Secondary | ICD-10-CM | POA: Diagnosis not present

## 2019-12-25 ENCOUNTER — Other Ambulatory Visit: Payer: Self-pay | Admitting: Internal Medicine

## 2019-12-25 DIAGNOSIS — R002 Palpitations: Secondary | ICD-10-CM | POA: Diagnosis not present

## 2019-12-25 DIAGNOSIS — Q230 Congenital stenosis of aortic valve: Secondary | ICD-10-CM | POA: Diagnosis not present

## 2019-12-25 DIAGNOSIS — Z23 Encounter for immunization: Secondary | ICD-10-CM | POA: Diagnosis not present

## 2019-12-25 DIAGNOSIS — E782 Mixed hyperlipidemia: Secondary | ICD-10-CM | POA: Diagnosis not present

## 2019-12-25 DIAGNOSIS — Q231 Congenital insufficiency of aortic valve: Secondary | ICD-10-CM | POA: Diagnosis not present

## 2019-12-25 DIAGNOSIS — I712 Thoracic aortic aneurysm, without rupture: Secondary | ICD-10-CM

## 2019-12-25 DIAGNOSIS — I7121 Aneurysm of the ascending aorta, without rupture: Secondary | ICD-10-CM

## 2019-12-30 DIAGNOSIS — F0150 Vascular dementia without behavioral disturbance: Secondary | ICD-10-CM | POA: Diagnosis not present

## 2019-12-30 DIAGNOSIS — G309 Alzheimer's disease, unspecified: Secondary | ICD-10-CM | POA: Diagnosis not present

## 2019-12-30 DIAGNOSIS — F0280 Dementia in other diseases classified elsewhere without behavioral disturbance: Secondary | ICD-10-CM | POA: Diagnosis not present

## 2019-12-30 DIAGNOSIS — R5382 Chronic fatigue, unspecified: Secondary | ICD-10-CM | POA: Diagnosis not present

## 2019-12-30 DIAGNOSIS — I63532 Cerebral infarction due to unspecified occlusion or stenosis of left posterior cerebral artery: Secondary | ICD-10-CM | POA: Diagnosis not present

## 2020-01-01 ENCOUNTER — Other Ambulatory Visit: Payer: Self-pay

## 2020-01-01 ENCOUNTER — Ambulatory Visit: Payer: PPO

## 2020-01-01 ENCOUNTER — Other Ambulatory Visit
Admission: RE | Admit: 2020-01-01 | Discharge: 2020-01-01 | Disposition: A | Discharge status: Home / Self Care | Payer: PPO | Source: Ambulatory Visit | Attending: Internal Medicine | Admitting: Internal Medicine

## 2020-01-01 ENCOUNTER — Ambulatory Visit
Admission: RE | Admit: 2020-01-01 | Discharge: 2020-01-01 | Disposition: A | Discharge status: Home / Self Care | Payer: PPO | Source: Ambulatory Visit | Attending: Internal Medicine | Admitting: Internal Medicine

## 2020-01-01 DIAGNOSIS — I7121 Aneurysm of the ascending aorta, without rupture: Secondary | ICD-10-CM

## 2020-01-01 DIAGNOSIS — I712 Thoracic aortic aneurysm, without rupture: Secondary | ICD-10-CM | POA: Diagnosis not present

## 2020-01-01 LAB — BASIC METABOLIC PANEL
Anion gap: 8 (ref 5–15)
BUN: 19 mg/dL (ref 8–23)
CO2: 26 mmol/L (ref 22–32)
Calcium: 9 mg/dL (ref 8.9–10.3)
Chloride: 104 mmol/L (ref 98–111)
Creatinine, Ser: 1.02 mg/dL (ref 0.61–1.24)
GFR, Estimated: >60 mL/min (ref >60)
Glucose, Bld: 99 mg/dL (ref 70–99)
Potassium: 4.3 mmol/L (ref 3.5–5.1)
Sodium: 138 mmol/L (ref 135–145)

## 2020-01-01 MED ORDER — IOHEXOL 350 MG/ML SOLN
100.0000 mL | Freq: Once | INTRAVENOUS | Status: AC | PRN
  Administered 2020-01-01: 100 mL via INTRAVENOUS

## 2020-01-06 ENCOUNTER — Encounter: Payer: Self-pay | Admitting: Physical Therapy

## 2020-01-06 ENCOUNTER — Ambulatory Visit: Payer: PPO | Attending: Neurology | Admitting: Physical Therapy

## 2020-01-06 ENCOUNTER — Other Ambulatory Visit: Payer: Self-pay

## 2020-01-06 DIAGNOSIS — M6281 Muscle weakness (generalized): Secondary | ICD-10-CM | POA: Insufficient documentation

## 2020-01-06 DIAGNOSIS — R2689 Other abnormalities of gait and mobility: Secondary | ICD-10-CM | POA: Diagnosis not present

## 2020-01-06 NOTE — Therapy (Signed)
[place] MAIN [facility] SERVICES [address] [place], [place], [address] Phone: [phone]   Fax:  [phone]  Physical Therapy Evaluation  Patient Details  Name: Ronald Adkins MRN: [id number] Date of Birth: 05-30-1945 Referring Provider (PT): Jennings Books   Encounter Date: 01/06/2020   PT End of Session - 01/06/20 1359    Visit Number 1    Number of Visits 17    Date for PT Re-Evaluation 03/02/20    PT Start Time 4235    PT Stop Time 1400    PT Time Calculation (min) 55 min    Equipment Utilized During Treatment Gait belt    Activity Tolerance Patient tolerated treatment well    Behavior During Therapy [facility] for tasks assessed/performed           Past Medical History:  Diagnosis Date  . Dementia ([place])   . Thyroid disease     Past Surgical History:  Procedure Laterality Date  . TEE WITHOUT CARDIOVERSION N/A 11/14/2018   Procedure: TRANSESOPHAGEAL ECHOCARDIOGRAM (TEE);  Surgeon: Corey Skains, MD;  Location: [facility] ORS;  Service: Cardiovascular;  Laterality: N/A;    There were no vitals filed for this visit.    Subjective Assessment - 01/06/20 1358    Subjective Patient reports that he is not doing any exercise.    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 He was in the hospital for 5 days and then was DC home. He had HHPT for a few visits and was DC. He is inactive and sits in his recliner all day.    Currently in Pain? No/denies    Multiple Pain Sites No              [facility] PT Assessment - 01/06/20 1315      Assessment   Medical Diagnosis malaise    Referring Provider (PT) Manuella Ghazi, [place]    Onset Date/Surgical Date 07/26/19    Hand Dominance Right    Prior Therapy HHPT      Precautions   Precautions Fall      Restrictions   Weight Bearing Restrictions No      Balance Screen   Has the patient fallen in the past 6 months Yes    How many times? 1    Has the patient had a decrease in activity level because  of a fear of falling?  Yes    Is the patient reluctant to leave their home because of a fear of falling?  No      Home Environment   Living Environment Private residence    Living Arrangements Spouse/significant other    Available Help at Discharge Family    Type of [place] to enter    Entrance Stairs-Number of Steps 4    Entrance Stairs-Rails Right    Home Layout Two level    Alternate Level Stairs-Number of Steps 1    Alternate Level Stairs-Rails None    Home Equipment Shower seat - built in;Grab bars - tub/shower      Prior Function   Level of Independence Independent    Vocation Retired    Leisure watch TV      Cognition   Overall Cognitive Status Within Functional Limits for tasks assessed      Standardized Balance Assessment   Standardized Balance Assessment Berg Balance Test      Berg Balance Test   Sit to Stand Able to stand  independently using  hands    Standing Unsupported Able to stand safely 2 minutes    Sitting with Back Unsupported but Feet Supported on Floor or Stool Able to sit safely and securely 2 minutes    Stand to Sit Sits safely with minimal use of hands    Transfers Able to transfer safely, definite need of hands    Standing Unsupported with Eyes Closed Able to stand 10 seconds safely    Standing Unsupported with Feet Together Able to place feet together independently and stand 1 minute safely    From Standing, Reach Forward with Outstretched Arm Can reach forward >12 cm safely (5")    From Standing Position, Pick up Object from Floor Able to pick up shoe safely and easily    From Standing Position, Turn to Look Behind Over each Shoulder Looks behind from both sides and weight shifts well    Turn 360 Degrees Able to turn 360 degrees safely one side only in 4 seconds or less    Standing Unsupported, Alternately Place Feet on Step/Stool Able to stand independently and complete 8 steps >20 seconds    Standing Unsupported, One Foot  in Front Needs help to step but can hold 15 seconds    Standing on One Leg Tries to lift leg/unable to hold 3 seconds but remains standing independently    Total Score 45             POSTURE: WNL   PROM/AROM: PROM BUE:WNL AROM BUE:WNL PROM BLE:WNL AROM BLE:WNL  STRENGTH:  Graded on a 0-5 scale Muscle Group Left Right  Shoulder flex /5 /5                      Hip Flex 5/5 5/5  Hip Abd 5/5 5/5  Hip Add 5/5 5/5  Hip Ext 5/5 5/5  Hip IR/ER 5/5 5/5  Knee Flex 5/5 5/5  Knee Ext 5/5 5/5  Ankle DF 5/5 5/5  Ankle PF 4/5 3/5   SENSATION:  BUE :WNL BLE : WNL  NEUROLOGICAL SCREEN: (2+ unless otherwise noted.) N=normal  Ab=abnormal   Level Dermatome R L  C3 Anterior Neck  N N  C4 Top of Shoulder N N  C5 Lateral Upper Arm  N N  C6 Lateral Arm/ Thumb  N N  C7 Middle Finger  N N  C8 4th & 5th Finger N N  T1 Medial Arm N N  L2 Medial thigh/groin N N  L3 Lower thigh/med.knee N N  L4 Medial leg/lat thigh N N  L5 Lat. leg & dorsal foot N N  S1 post/lat foot/thigh/leg N N  S2 Post./med. thigh & leg N N    SOMATOSENSORY:  Any N & T in extremities or weakness: reports :         Sensation           Intact      Diminished         Absent  Light touch LEs                              COORDINATION: Finger to Nose: WNL  FUNCTIONAL MOBILITY: transfers with definite need of BUE   BALANCE: Static Standing Balance  Normal Able to maintain standing balance against maximal resistance   Good Able to maintain standing balance against moderate resistance x  Good-/Fair+ Able to maintain standing balance against minimal resistance   Fair Able to stand  unsupported without UE support and without LOB for 1-2 min   Fair- Requires Min A and UE support to maintain standing without loss of balance   Poor+ Requires mod A and UE support to maintain standing without loss of balance   Poor Requires max A and UE support to maintain standing balance without loss    Standing Dynamic  Balance  Normal Stand independently unsupported, able to weight shift and cross midline maximally   Good Stand independently unsupported, able to weight shift and cross midline moderately x  Good-/Fair+ Stand independently unsupported, able to weight shift across midline minimally   Fair Stand independently unsupported, weight shift, and reach ipsilaterally, loss of balance when crossing midline   Poor+ Able to stand with Min A and reach ipsilaterally, unable to weight shift   Poor Able to stand with Mod A and minimally reach ipsilaterally, unable to cross midline.       GAIT: Patient ambulates without AD and no path deviation  OUTCOME MEASURES: TEST Outcome Interpretation  5 times sit<>stand 19.50sec >60 yo, >15 sec indicates increased risk for falls  10 meter walk test      1.18           m/s <1.0 m/s indicates increased risk for falls; limited community ambulator  Timed up and Go      8.93           sec <14 sec indicates increased risk for falls  6 minute walk test    1110            Feet 1000 feet is community ambulator 1836 is range for 70-74 male  Berg balance 45/56 2.5 higher risk for falls                 Objective measurements completed on examination: See above findings.               PT Education - 01/06/20 1318    Education Details plan of care    Person(s) Educated Patient    Methods Explanation    Comprehension Verbalized understanding            PT Short Term Goals - 01/06/20 1411      PT SHORT TERM GOAL #1   Title Patient will be independent in home exercise program to improve strength/mobility for better functional independence with ADLs.    Time 4    Period Weeks    Status New             PT Long Term Goals - 01/06/20 1415      PT LONG TERM GOAL #1   Title Patient (> 24 years old) will complete five times sit to stand test in < 15 seconds indicating an increased LE strength and improved balance.    Time 8    Period Weeks     Status New    Target Date 03/02/20      PT LONG TERM GOAL #2   Title Patient will increase Berg Balance score by > 6 points to demonstrate decreased fall risk during functional activities.    Time 8    Period Weeks    Status New    Target Date 03/02/20      PT LONG TERM GOAL #3   Title Patient will increase BLE gross ankle strength to 4+/5 as to improve functional strength for independent gait, increased standing tolerance and increased ADL ability.    Time 8    Period Weeks  Status New                  Plan - 01/06/20 1400    Clinical Impression Statement Patient presents with decreased balance, and decreased BLE ankle strength. Patient's main complaint is  inability to participate in desired activities. Patient wants to improve his balance and ability to ambulate on  surfaces safely. Patient will benefit from skilled PT in order to increase gait speed, increase BLE strength, and improve dynamic standing balance to decrease risk for falls and enable patient to participate in desired activities.   Stability/Clinical Decision Making Stable/Uncomplicated    Clinical Decision Making Low    Rehab Potential Good    PT Frequency 2x / week    PT Duration 8 weeks    PT Treatment/Interventions Patient/family education;Neuromuscular re-education;Therapeutic activities;Gait training;Therapeutic exercise;Balance training    PT Next Visit Plan ankle strengthening, balance training    PT Home Exercise Plan hee raises wiht 2 se hold    Consulted and Agree with Plan of Care Patient;Family member/caregiver           Patient will benefit from skilled therapeutic intervention in order to improve the following deficits and impairments:  Decreased activity tolerance, Decreased endurance, Decreased strength, Decreased balance, Decreased mobility, Difficulty walking  Visit Diagnosis: Muscle weakness (generalized)  Other abnormalities of gait and mobility     Problem List Patient  Active Problem List   Diagnosis Date Noted  . Acute CVA (cerebrovascular accident) ([place]) 11/11/2018    Alanson Puls, PT DPT 01/06/2020, 2:24 PM  [place] MAIN [facility] SERVICES [address] [place], [place], [address] Phone: [phone]   Fax:  [phone]  Name: BALDO HUFNAGLE MRN: [id number] Date of Birth: February 20, 1946

## 2020-01-07 ENCOUNTER — Ambulatory Visit: Payer: PPO

## 2020-01-09 ENCOUNTER — Ambulatory Visit: Payer: PPO

## 2020-01-09 ENCOUNTER — Other Ambulatory Visit: Payer: Self-pay

## 2020-01-09 DIAGNOSIS — R2689 Other abnormalities of gait and mobility: Secondary | ICD-10-CM

## 2020-01-09 DIAGNOSIS — M6281 Muscle weakness (generalized): Secondary | ICD-10-CM

## 2020-01-09 NOTE — Therapy (Signed)
[place] MAIN [facility] SERVICES [address] [place], [place], [address] Phone: [phone]   Fax:  [phone]  Physical Therapy Treatment  Patient Details  Name: Ronald Adkins MRN: [id number] Date of Birth: [birth year] Referring Provider (PT): Jennings Books   Encounter Date: 01/09/2020   PT End of Session - 01/09/20 1258    Visit Number 2    Number of Visits 17    Date for PT Re-Evaluation 03/02/20    PT Start Time 1300    PT Stop Time 1344    PT Time Calculation (min) 44 min    Equipment Utilized During Treatment Gait belt    Activity Tolerance Patient tolerated treatment well    Behavior During Therapy [facility] for tasks assessed/performed           Past Medical History:  Diagnosis Date   Dementia ([place])    Thyroid disease     Past Surgical History:  Procedure Laterality Date   TEE WITHOUT CARDIOVERSION N/A 11/14/2018   Procedure: TRANSESOPHAGEAL ECHOCARDIOGRAM (TEE);  Surgeon: Corey Skains, MD;  Location: [facility] ORS;  Service: Cardiovascular;  Laterality: N/A;    There were no vitals filed for this visit.   Subjective Assessment - 01/09/20 1326    Subjective Patient reports he is tired from working out with his granddaughter the day before. Hasn't done any exercises, no falls or LOB since last session.    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 He was in the hospital for 5 days and then was DC home. He had HHPT for a few visits and was DC. He is inactive and sits in his recliner all day.    Currently in Pain? No/denies                Treatment:  Standing in // bars:    Neuro Re-ed airex pad: reach for balls outside of BOS and throw at target x 17 airex balance beam: lateral stepping 4x length of // bars, cues for no UE support and smaller steps. airex pad 6" step modified tandem stance 2x 30 second holds each LE placement  airex pad 6" step toe taps 12x each LE  airex pad: lateral step up/down 6" step  sandwhiched by airex pads UE support. 10x each directoin   TherEx:  Standing with # 5lb ankle weight: CGA for stability  -Hip extension with BUE upper extremity support, cueing for neutral hip alignment, upright posture for optimal muscle recruitment, and sequencing, 10x each LE,  -Hip abduction with BUE upper extremity support, cueing for neutral foot alignment for correct muscle activation, 10x each LE -Hip flexion with BUE upper extremity support, cueing for body mechanics, speed of muscle recruitment for optimal strengthening and stabilization 10x each LE   Seated with # BUE ankle weights  -Seated marches with upright posture, back away from back of chair for abdominal/trunk activation/stabilization, 10x each LE -Seated LAQ with 3 second holds, 10x each LE, cueing for muscle activation and sequencing for neutral alignment -Seated IR/ER with cueing for stabilizing knee placement with lateral foot movement for optimal muscle recruitment, 10x each LE  10x STS    Access Code: [email or web address] URL: [email or web address] Date: 01/09/2020 Prepared by: Janna Arch  Program Notes  **Be sure to perform all exercises next to a countertop or sturdy piece of furniture in case you become unsteady.  Exercises   Standing Heel Raise with Support - 1 x daily - 7 x weekly - 2 sets - 15  reps  Standing Toe Raises at Chair - 1 x daily - 7 x weekly - 2 sets - 15 reps  Mini Squat with Counter Support - 1 x daily - 7 x weekly - 2 sets - 15 reps  Lunge with Counter Support - 1 x daily - 7 x weekly - 2 sets - 15 reps  Standing Tandem Balance with Counter Support - 1 x daily - 7 x weekly - 2 sets - 2 reps - 30 hold  Standing Single Leg Stance with Counter Support - 1 x daily - 7 x weekly - 2 sets - 2 reps - 30 hold                   PT Education - 01/09/20 1257    Education Details exercise technique, body mechanics, HEP    Person(s) Educated Patient    Methods  Explanation;Demonstration;Tactile cues;Verbal cues    Comprehension Verbalized understanding;Returned demonstration;Verbal cues required;Tactile cues required            PT Short Term Goals - 01/06/20 1411      PT SHORT TERM GOAL #1   Title Patient will be independent in home exercise program to improve strength/mobility for better functional independence with ADLs.    Time 4    Period Weeks    Status New             PT Long Term Goals - 01/06/20 1415      PT LONG TERM GOAL #1   Title Patient (> 50 years old) will complete five times sit to stand test in < 15 seconds indicating an increased LE strength and improved balance.    Time 8    Period Weeks    Status New    Target Date 03/02/20      PT LONG TERM GOAL #2   Title Patient will increase Berg Balance score by > 6 points to demonstrate decreased fall risk during functional activities.    Time 8    Period Weeks    Status New    Target Date 03/02/20      PT LONG TERM GOAL #3   Title Patient will increase BLE gross ankle strength to 4+/5 as to improve functional strength for independent gait, increased standing tolerance and increased ADL ability.    Time 8    Period Weeks    Status New                 Plan - 01/09/20 1424    Clinical Impression Statement Patient educated on and performed HEP demonstrating understanding. Strengthening and stabilization interventions tolerated however patient does require frequent rest breaks due to fatigue and limited tolerance. Patient will benefit from skilled PT in order to increase gait speed, increase BLE strength, and improve dynamic standing balance to decrease risk for falls and enable patient to participate in desired activities    Stability/Clinical Decision Making Stable/Uncomplicated    Rehab Potential Good    PT Frequency 2x / week    PT Duration 8 weeks    PT Treatment/Interventions Patient/family education;Neuromuscular re-education;Therapeutic activities;Gait  training;Therapeutic exercise;Balance training    PT Next Visit Plan ankle strengthening, balance training    PT Home Exercise Plan hee raises wiht 2 se hold    Consulted and Agree with Plan of Care Patient;Family member/caregiver           Patient will benefit from skilled therapeutic intervention in order to improve the following deficits and impairments:  Decreased activity tolerance, Decreased endurance, Decreased strength, Decreased balance, Decreased mobility, Difficulty walking  Visit Diagnosis: Muscle weakness (generalized)  Other abnormalities of gait and mobility     Problem List Patient Active Problem List   Diagnosis Date Noted   Acute CVA (cerebrovascular accident) ([place]) 11/11/2018   Janna Arch, PT, DPT   01/09/2020, 2:26 PM  [place] MAIN [facility] SERVICES [address] [place], [place], [address] Phone: [phone]   Fax:  [phone]  Name: Ronald Adkins MRN: [id number] Date of Birth: 1945/05/19

## 2020-01-14 ENCOUNTER — Other Ambulatory Visit: Payer: Self-pay

## 2020-01-14 ENCOUNTER — Ambulatory Visit: Payer: PPO

## 2020-01-14 DIAGNOSIS — R2689 Other abnormalities of gait and mobility: Secondary | ICD-10-CM

## 2020-01-14 DIAGNOSIS — M6281 Muscle weakness (generalized): Secondary | ICD-10-CM

## 2020-01-14 NOTE — Therapy (Signed)
[place] MAIN [facility] SERVICES [address] [place], [place], [address] Phone: [phone]   Fax:  [phone]  Physical Therapy Treatment  Patient Details  Name: Ronald Adkins MRN: [id number] Date of Birth: 07-Dec-1945 Referring Provider (PT): Jennings Books   Encounter Date: 01/14/2020   PT End of Session - 01/14/20 1350    Visit Number 3    Number of Visits 17    Date for PT Re-Evaluation 03/02/20    PT Start Time 1331    PT Stop Time 1411    PT Time Calculation (min) 40 min    Equipment Utilized During Treatment Gait belt    Activity Tolerance Patient tolerated treatment well;No increased pain    Behavior During Therapy WFL for tasks assessed/performed           Past Medical History:  Diagnosis Date  . Dementia ([place])   . Thyroid disease     Past Surgical History:  Procedure Laterality Date  . TEE WITHOUT CARDIOVERSION N/A 11/14/2018   Procedure: TRANSESOPHAGEAL ECHOCARDIOGRAM (TEE);  Surgeon: Corey Skains, MD;  Location: [facility] ORS;  Service: Cardiovascular;  Laterality: N/A;    There were no vitals filed for this visit.   Subjective Assessment - 01/14/20 1342    Subjective Pt doing well. Repors no HEP compliance. Works out with granddaughter.    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 He was in the hospital for 5 days and then was DC home. He had HHPT for a few visits and was DC. He is inactive and sits in his recliner all day.    Currently in Pain? No/denies          Intervention this date:   Standing in // bars:     Neuro Re-ed -airex pad: reach for balls outside of BOS and throw at target -airex pad 6" step modified tandem stance 2x 30 second holds each LE placement  -airex pad 6" step toe taps 12x each LE  -airex FWD step up 1x10; 2airex FWD step-up  -airex pad: lateral step up/down 6" step sandwhiched by airex pads UE support. 10x each directoin    -STS from cahir, hands free 2x10 -Standing heel  raise 2x15, BUE support ad lib   TherEx:  Standing with # 5lb ankle weight: CGA for stability -BLE hip extension 2x10 bilat  -BLE hip ABD 2x10 bilat -BLE hip Flexion 2x10 bilat  -seated LAQ 2x10 bilat       PT Short Term Goals - 01/06/20 1411      PT SHORT TERM GOAL #1   Title Patient will be independent in home exercise program to improve strength/mobility for better functional independence with ADLs.    Time 4    Period Weeks    Status New             PT Long Term Goals - 01/06/20 1415      PT LONG TERM GOAL #1   Title Patient (> 47 years old) will complete five times sit to stand test in < 15 seconds indicating an increased LE strength and improved balance.    Time 8    Period Weeks    Status New    Target Date 03/02/20      PT LONG TERM GOAL #2   Title Patient will increase Berg Balance score by > 6 points to demonstrate decreased fall risk during functional activities.    Time 8    Period Weeks  Status New    Target Date 03/02/20      PT LONG TERM GOAL #3   Title Patient will increase BLE gross ankle strength to 4+/5 as to improve functional strength for independent gait, increased standing tolerance and increased ADL ability.    Time 8    Period Weeks    Status New                 Plan - 01/14/20 1350    Clinical Impression Statement Continued to work on balance interventions. Pt is minimally interactive in session, but does respond when engaged, however he does not reliably offer when he is fatigued and need a rest break. Continued with balance acitivty pt most often has error with catching toes on surfaces. Pt fatigued with strengthening exercises, offered seated recovery intervals, which he takes readily. Will continue to monitor fatigue.    Stability/Clinical Decision Making Stable/Uncomplicated    Clinical Decision Making Low    Rehab Potential Good    PT Frequency 2x / week    PT Duration 8 weeks    PT Treatment/Interventions Patient/family  education;Neuromuscular re-education;Therapeutic activities;Gait training;Therapeutic exercise;Balance training    PT Next Visit Plan ankle strengthening, balance training    PT Home Exercise Plan heel raises with 2 sec hold    Consulted and Agree with Plan of Care Patient;Family member/caregiver           Patient will benefit from skilled therapeutic intervention in order to improve the following deficits and impairments:  Decreased activity tolerance, Decreased endurance, Decreased strength, Decreased balance, Decreased mobility, Difficulty walking  Visit Diagnosis: Muscle weakness (generalized)  Other abnormalities of gait and mobility     Problem List Patient Active Problem List   Diagnosis Date Noted  . Acute CVA (cerebrovascular accident) ([place]) 11/11/2018   2:09 PM, 01/14/20 Etta Grandchild, PT, DPT Physical Therapist - [facility]  Outpatient Physical [place] [phone]     Etta Grandchild 01/14/2020, 1:52 PM  [place] MAIN [facility] SERVICES [address] [place], [place], [address] Phone: [phone]   Fax:  [phone]  Name: Ronald Adkins MRN: [id number] Date of Birth: [birth year]

## 2020-01-16 ENCOUNTER — Other Ambulatory Visit: Payer: Self-pay

## 2020-01-16 ENCOUNTER — Ambulatory Visit: Payer: PPO

## 2020-01-16 DIAGNOSIS — M6281 Muscle weakness (generalized): Secondary | ICD-10-CM

## 2020-01-16 NOTE — Therapy (Signed)
[place] MAIN [facility] SERVICES [address] [place], [place], [address] Phone: [phone]   Fax:  [phone]  Physical Therapy Treatment  Patient Details  Name: Ronald Adkins MRN: [id number] Date of Birth: 04/29/1945 Referring Provider (PT): Jennings Books   Encounter Date: 01/16/2020   PT End of Session - 01/16/20 1115    Visit Number 4    Number of Visits 17    Date for PT Re-Evaluation 03/02/20    PT Start Time 1110    PT Stop Time 1155    PT Time Calculation (min) 45 min    Equipment Utilized During Treatment Gait belt    Activity Tolerance Patient tolerated treatment well;No increased pain    Behavior During Therapy [facility] for tasks assessed/performed           Past Medical History:  Diagnosis Date   Dementia ([place])    Thyroid disease     Past Surgical History:  Procedure Laterality Date   TEE WITHOUT CARDIOVERSION N/A 11/14/2018   Procedure: TRANSESOPHAGEAL ECHOCARDIOGRAM (TEE);  Surgeon: Corey Skains, MD;  Location: [facility] ORS;  Service: Cardiovascular;  Laterality: N/A;    There were no vitals filed for this visit.   Subjective Assessment - 01/16/20 1113    Subjective Patient denies of any new symptoms since last therapy session.    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 He was in the hospital for 5 days and then was DC home. He had HHPT for a few visits and was DC. He is inactive and sits in his recliner all day.    Currently in Pain? No/denies            Intervention this date:   Standing in // bars:     Neuro Re-ed airex pad 6" step toe taps 12x each LE  airex FWD step up 1x10; 2airex FWD step-up  airex pad: lateral step up/down 6" step sandwhiched by airex pads UE support. 10x each directoin  airex: WBOS x eyes open and eyes closed x 3 sets of 30s   STS from chair, hands free 2x10 Standing heel raise 2x15, BUE support ad lib   TherEx:  Standing with # 5lb ankle weight: CGA for  stability BLE hip extension 2x10 bilat  BLE hip ABD 2x10 bilat BLE hip Flexion 2x10 bilat  seated LAQ 2x10 bilat  seated crossover hip adduction/abduction with foam beam between with #5, 2 x10bilat    Patient tolerated strength training with increased fatigue due to decreased functional activity tolerance. Patient required constant verbal cues to actively participate in therapy today. He continues to require repeated seated therapeutic rested breaks due to fatigue. Therapist provided verbal and tactile cues to maintain COG within BOS with static unsupported standing dynamic balance exercises. Patient would benefit from continued physical therapy skills in order to increase BLE strength, decrease balance deficits, and improve functional activity tolerance to improve patient's quality of life.                        PT Short Term Goals - 01/06/20 1411      PT SHORT TERM GOAL #1   Title Patient will be independent in home exercise program to improve strength/mobility for better functional independence with ADLs.    Time 4    Period Weeks    Status New             PT Long Term Goals - 01/06/20 1415  PT LONG TERM GOAL #1   Title Patient (> 30 years old) will complete five times sit to stand test in < 15 seconds indicating an increased LE strength and improved balance.    Time 8    Period Weeks    Status New    Target Date 03/02/20      PT LONG TERM GOAL #2   Title Patient will increase Berg Balance score by > 6 points to demonstrate decreased fall risk during functional activities.    Time 8    Period Weeks    Status New    Target Date 03/02/20      PT LONG TERM GOAL #3   Title Patient will increase BLE gross ankle strength to 4+/5 as to improve functional strength for independent gait, increased standing tolerance and increased ADL ability.    Time 8    Period Weeks    Status New                 Plan - 01/16/20 1159    Clinical Impression  Statement Patient tolerated strength training with increased fatigue due to decreased functional activity tolerance. Patient required constant verbal cues to actively participate in therapy today. He continues to require repeated seated therapeutic rested breaks due to fatigue. Therapist provided verbal and tactile cues to maintain COG within BOS with static unsupported standing dynamic balance exercises. Patient would benefit from continued physical therapy skills in order to increase BLE strength, decrease balance deficits, and improve functional activity tolerance to improve patient's quality of life.    Stability/Clinical Decision Making Stable/Uncomplicated    Rehab Potential Good    PT Frequency 2x / week    PT Duration 8 weeks    PT Treatment/Interventions Patient/family education;Neuromuscular re-education;Therapeutic activities;Gait training;Therapeutic exercise;Balance training    PT Next Visit Plan ankle strengthening, balance training    PT Home Exercise Plan heel raises with 2 sec hold    Consulted and Agree with Plan of Care Patient;Family member/caregiver           Patient will benefit from skilled therapeutic intervention in order to improve the following deficits and impairments:  Decreased activity tolerance, Decreased endurance, Decreased strength, Decreased balance, Decreased mobility, Difficulty walking  Visit Diagnosis: Muscle weakness (generalized)     Problem List Patient Active Problem List   Diagnosis Date Noted   Acute CVA (cerebrovascular accident) ([place]) 11/11/2018   Phillips Grout PT, DPT, GCS  Wah Sabic 01/16/2020, 12:03 PM  [place] MAIN [facility] SERVICES [address] [place], [place], [address] Phone: [phone]   Fax:  [phone]  Name: Ronald Adkins MRN: [id number] Date of Birth: May 23, 1945

## 2020-01-21 ENCOUNTER — Other Ambulatory Visit: Payer: Self-pay

## 2020-01-21 ENCOUNTER — Ambulatory Visit: Payer: PPO

## 2020-01-21 DIAGNOSIS — M6281 Muscle weakness (generalized): Secondary | ICD-10-CM | POA: Diagnosis not present

## 2020-01-21 DIAGNOSIS — R2689 Other abnormalities of gait and mobility: Secondary | ICD-10-CM

## 2020-01-21 NOTE — Therapy (Signed)
[place] MAIN [facility] SERVICES [address] [place], [place], [address] Phone: [phone]   Fax:  [phone]  Physical Therapy Treatment  Patient Details  Name: Ronald Adkins MRN: [id number] Date of Birth: April 21, 1945 Referring Provider (PT): Jennings Books   Encounter Date: 01/21/2020   PT End of Session - 01/21/20 1108    Visit Number 5    Number of Visits 17    Date for PT Re-Evaluation 03/02/20    PT Start Time 1638    PT Stop Time 1150    PT Time Calculation (min) 45 min    Equipment Utilized During Treatment Gait belt    Activity Tolerance Patient tolerated treatment well;No increased pain    Behavior During Therapy WFL for tasks assessed/performed           Past Medical History:  Diagnosis Date  . Dementia ([place])   . Thyroid disease     Past Surgical History:  Procedure Laterality Date  . TEE WITHOUT CARDIOVERSION N/A 11/14/2018   Procedure: TRANSESOPHAGEAL ECHOCARDIOGRAM (TEE);  Surgeon: Corey Skains, MD;  Location: [facility] ORS;  Service: Cardiovascular;  Laterality: N/A;    There were no vitals filed for this visit.   Subjective Assessment - 01/21/20 1107    Subjective Patient denies of any soreness or new symptoms since last therapy session.    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 He was in the hospital for 5 days and then was DC home. He had HHPT for a few visits and was DC. He is inactive and sits in his recliner all day.    Currently in Pain? No/denies           Intervention this date:   Standing in // bars:     Neuro Re-ed airex pad 6" step toe taps 12x each LE  airex FWD step up 1x10; 2airex FWD step-up  airex pad: lateral step up/down 6" step in each direction  airex: WBOS x eyes open x 3 sets of 30s airex: NBOS x eyes open x 3 sets of 30s  STS from chair, hands free 2x10 Standing heel raise 2x15, BUE support ad lib   TherEx:  Standing with # 5lb ankle weight: CGA for stability BLE hip  extension 2x10 bilat  BLE hip ABD 2x10 bilat BLE hip Flexion 2x10 bilat  seated LAQ 2x10 bilat  seated crossover hip adduction/abduction with foam beam between with #5, 2 x10bilat Resisted side stepping with green tband around ankles in // bars with BUE support x 3 lengths each direction   Patient tolerated strengthening exercises with frequent seated rest breaks due to limited tolerance to activity. Therapist provided redirection of patent's attention to actively participate in therapy session today. Patient demonstrates increased swaying laterally with balance exercises. Therapy will continue to focus on increasing BLE strength, decreased balance deficits, and improve functional activity tolerance to improve patient's quality of life.       PT Short Term Goals - 01/06/20 1411      PT SHORT TERM GOAL #1   Title Patient will be independent in home exercise program to improve strength/mobility for better functional independence with ADLs.    Time 4    Period Weeks    Status New             PT Long Term Goals - 01/06/20 1415      PT LONG TERM GOAL #1   Title Patient (> 21 years old) will complete five times  sit to stand test in < 15 seconds indicating an increased LE strength and improved balance.    Time 8    Period Weeks    Status New    Target Date 03/02/20      PT LONG TERM GOAL #2   Title Patient will increase Berg Balance score by > 6 points to demonstrate decreased fall risk during functional activities.    Time 8    Period Weeks    Status New    Target Date 03/02/20      PT LONG TERM GOAL #3   Title Patient will increase BLE gross ankle strength to 4+/5 as to improve functional strength for independent gait, increased standing tolerance and increased ADL ability.    Time 8    Period Weeks    Status New                 Plan - 01/21/20 1201    Clinical Impression Statement Patient tolerated strengthening exercises with frequent seated rest breaks due to  limited tolerance to activity. Therapist provided redirection of patent's attention to actively participate in therapy session today. Patient demonstrates increased swaying laterally with balance exercises. Therapy will continue to focus on increasing BLE strength, decreased balance deficits, and improve functional activity tolerance to improve patient's quality of life.    Stability/Clinical Decision Making Stable/Uncomplicated    Rehab Potential Good    PT Frequency 2x / week    PT Duration 8 weeks    PT Treatment/Interventions Patient/family education;Neuromuscular re-education;Therapeutic activities;Gait training;Therapeutic exercise;Balance training    PT Next Visit Plan ankle strengthening, balance training    PT Home Exercise Plan heel raises with 2 sec hold    Consulted and Agree with Plan of Care Patient;Family member/caregiver           Patient will benefit from skilled therapeutic intervention in order to improve the following deficits and impairments:  Decreased activity tolerance, Decreased endurance, Decreased strength, Decreased balance, Decreased mobility, Difficulty walking  Visit Diagnosis: Muscle weakness (generalized)  Other abnormalities of gait and mobility     Problem List Patient Active Problem List   Diagnosis Date Noted  . Acute CVA (cerebrovascular accident) [facility]) 11/11/2018    Karl Luke PT, DPT [name] 01/21/2020, 12:52 PM  [place] MAIN [facility] SERVICES [address] [place], [place], [address] Phone: [phone]   Fax:  [phone]  Name: Ronald Adkins MRN: [id number] Date of Birth: Jun 29, 1945

## 2020-01-23 ENCOUNTER — Other Ambulatory Visit: Payer: Self-pay

## 2020-01-23 ENCOUNTER — Ambulatory Visit: Payer: PPO

## 2020-01-23 DIAGNOSIS — R2689 Other abnormalities of gait and mobility: Secondary | ICD-10-CM

## 2020-01-23 DIAGNOSIS — M6281 Muscle weakness (generalized): Secondary | ICD-10-CM

## 2020-01-23 NOTE — Therapy (Signed)
[place] MAIN [facility] SERVICES [address] [place], [place], [address] Phone: [phone]   Fax:  [phone]  Physical Therapy Treatment  Patient Details  Name: Ronald Adkins MRN: [id number] Date of Birth: 06-26-1945 Referring Provider (PT): Jennings Books   Encounter Date: 01/23/2020   PT End of Session - 01/23/20 1307    Visit Number 6    Number of Visits 17    Date for PT Re-Evaluation 03/02/20    PT Start Time 1300    PT Stop Time 1345    PT Time Calculation (min) 45 min    Equipment Utilized During Treatment Gait belt    Activity Tolerance Patient tolerated treatment well;No increased pain    Behavior During Therapy WFL for tasks assessed/performed           Past Medical History:  Diagnosis Date  . Dementia ([place])   . Thyroid disease     Past Surgical History:  Procedure Laterality Date  . TEE WITHOUT CARDIOVERSION N/A 11/14/2018   Procedure: TRANSESOPHAGEAL ECHOCARDIOGRAM (TEE);  Surgeon: Corey Skains, MD;  Location: [facility] ORS;  Service: Cardiovascular;  Laterality: N/A;    There were no vitals filed for this visit.   Subjective Assessment - 01/23/20 1309    Subjective Patient states he is sore from last therapy session. No fall or pain reported at today's session.    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 He was in the hospital for 5 days and then was DC home. He had HHPT for a few visits and was DC. He is inactive and sits in his recliner all day.    Currently in Pain? No/denies             Intervention this date:   Standing in // bars:     Neuro Re-ed airex pad 6" step toe taps 12x each LE  airex FWD step up 1x10; 2airex FWD step-up  airex pad: lateral step up/down 6" step in each direction  airex: WBOS x eyes open x 3 sets of 30s airex: NBOS x eyes open x 3 sets of 30s  STS from chair, hands free 2x10 Standing heel raise 2x15, BUE support ad lib  Tandem walking on airex beam in // bars x 3  laps (no UE) Side stepping on airex beam in // bars x 3 laps (no UE)  TherEx:  Standing with # 5lb ankle weight: CGA for stability BLE hip extension 2x10 bilat  BLE hip ABD 2x10 bilat BLE hip Flexion 2x10 bilat  seated LAQ 2x10 bilat  seated crossover hip adduction/abduction with foam beam between with #5, 2 x10bilat Seated marches 3 x 10 bilateral  Resisted side stepping with green tband around ankles in // bars with BUE support x 3 lengths each direction   Patient demonstrated good motivation throughout therapy session. Therapist provided verbal cues to maintain spine in neutral when completing standing hip exercises to avoid compensation. Patient continues to require constant seated rest breaks and redirection of attention to focus on task and complete exercises. Will continue to progress BLE strength, decrease balance deficits, and improve functional activity tolerance to improve patient's quality of life.           PT Short Term Goals - 01/06/20 1411      PT SHORT TERM GOAL #1   Title Patient will be independent in home exercise program to improve strength/mobility for better functional independence with ADLs.    Time 4  Period Weeks    Status New             PT Long Term Goals - 01/06/20 1415      PT LONG TERM GOAL #1   Title Patient (> 41 years old) will complete five times sit to stand test in < 15 seconds indicating an increased LE strength and improved balance.    Time 8    Period Weeks    Status New    Target Date 03/02/20      PT LONG TERM GOAL #2   Title Patient will increase Berg Balance score by > 6 points to demonstrate decreased fall risk during functional activities.    Time 8    Period Weeks    Status New    Target Date 03/02/20      PT LONG TERM GOAL #3   Title Patient will increase BLE gross ankle strength to 4+/5 as to improve functional strength for independent gait, increased standing tolerance and increased ADL ability.    Time 8     Period Weeks    Status New                 Plan - 01/23/20 1524    Clinical Impression Statement Patient demonstrated good motivation throughout therapy session. Therapist provided verbal cues to maintain spine in neutral when completing standing hip exercises to avoid compensation. Patient continues to require constant seated rest breaks and redirection of attention to focus on task and complete exercises. Will continue to progress BLE strength, decrease balance deficits, and improve functional activity tolerance to improve patient's quality of life.    Stability/Clinical Decision Making Stable/Uncomplicated    Rehab Potential Good    PT Frequency 2x / week    PT Duration 8 weeks    PT Treatment/Interventions Patient/family education;Neuromuscular re-education;Therapeutic activities;Gait training;Therapeutic exercise;Balance training    PT Next Visit Plan ankle strengthening, balance training    PT Home Exercise Plan heel raises with 2 sec hold    Consulted and Agree with Plan of Care Patient;Family member/caregiver           Patient will benefit from skilled therapeutic intervention in order to improve the following deficits and impairments:  Decreased activity tolerance, Decreased endurance, Decreased strength, Decreased balance, Decreased mobility, Difficulty walking  Visit Diagnosis: Muscle weakness (generalized)  Other abnormalities of gait and mobility     Problem List Patient Active Problem List   Diagnosis Date Noted  . Acute CVA (cerebrovascular accident) [facility]) 11/11/2018   Karl Luke PT, DPT [name] 01/23/2020, 3:31 PM  [place] MAIN [facility] SERVICES [address] [place], [place], [address] Phone: [phone]   Fax:  [phone]  Name: Ronald Adkins MRN: [id number] Date of Birth: [birth year]

## 2020-01-28 ENCOUNTER — Other Ambulatory Visit: Payer: Self-pay

## 2020-01-28 ENCOUNTER — Ambulatory Visit: Payer: PPO

## 2020-01-28 DIAGNOSIS — R2689 Other abnormalities of gait and mobility: Secondary | ICD-10-CM

## 2020-01-28 DIAGNOSIS — M6281 Muscle weakness (generalized): Secondary | ICD-10-CM | POA: Diagnosis not present

## 2020-01-28 NOTE — Therapy (Signed)
[place] MAIN [facility] SERVICES [address] [place], [place], [address] Phone: [phone]   Fax:  [phone]  Physical Therapy Treatment  Patient Details  Name: Ronald Adkins MRN: [id number] Date of Birth: May 19, 1945 Referring Provider (PT): Jennings Books   Encounter Date: 01/28/2020   PT End of Session - 01/28/20 1106    Visit Number 7    Number of Visits 17    Date for PT Re-Evaluation 03/02/20    PT Start Time 1055    PT Stop Time 1135    PT Time Calculation (min) 40 min    Equipment Utilized During Treatment Gait belt    Activity Tolerance Patient tolerated treatment well;No increased pain    Behavior During Therapy WFL for tasks assessed/performed           Past Medical History:  Diagnosis Date  . Dementia ([place])   . Thyroid disease     Past Surgical History:  Procedure Laterality Date  . TEE WITHOUT CARDIOVERSION N/A 11/14/2018   Procedure: TRANSESOPHAGEAL ECHOCARDIOGRAM (TEE);  Surgeon: Corey Skains, MD;  Location: [facility] ORS;  Service: Cardiovascular;  Laterality: N/A;    There were no vitals filed for this visit.   Subjective Assessment - 01/28/20 1105    Subjective Pt doing well todya. Headed for the beach after today's session. Reports he does notice some improvement swith balance at home since starting therapy    Pertinent History Patient had a CVA 11/11/18. He was in the hospital for 5 days and then was DC home. He had HHPT for a few visits and was DC. He is inactive and sits in his recliner all day.    Currently in Pain? No/denies           Intervention this date:   Standing in // bars:     Neuro Re-ed airex pad 6" step toe taps 20x each LE  airex FWD 2airex 1x12 bilat, later 1x10 bilat  airex: NBOS x eyes closed x 3 sets of 30s   STS from chair, hands free 2x10 Standing heel raise 2x20, BUE support ad lib   Tandem walking on airex beam in // bars x 3 laps (no UE) Side stepping on airex beam in  // bars x 3 laps (no UE)  Cable resisted AMB 2x FWD, 2x retro, 2x each lateral direction 12.5lb, 4" step at end.     NBOS airex pad with ball toss catch x15, ball slam x15, blam slam R/L c partial trunk rotation; wall rebound x15   Patient demonstrated good motivation throughout therapy session. Therapist provided verbal cues to maintain spine in neutral when completing standing hip exercises to avoid compensation. Patient continues to require constant seated rest breaks and redirection of attention to focus on task and complete exercises. Will continue to progress BLE strength, decrease balance deficits, and improve functional activity tolerance to improve patient's quality of life.          PT Short Term Goals - 01/06/20 1411      PT SHORT TERM GOAL #1   Title Patient will be independent in home exercise program to improve strength/mobility for better functional independence with ADLs.    Time 4    Period Weeks    Status New             PT Long Term Goals - 01/06/20 1415      PT LONG TERM GOAL #1   Title Patient (> 72 years old) will  complete five times sit to stand test in < 15 seconds indicating an increased LE strength and improved balance.    Time 8    Period Weeks    Status New    Target Date 03/02/20      PT LONG TERM GOAL #2   Title Patient will increase Berg Balance score by > 6 points to demonstrate decreased fall risk during functional activities.    Time 8    Period Weeks    Status New    Target Date 03/02/20      PT LONG TERM GOAL #3   Title Patient will increase BLE gross ankle strength to 4+/5 as to improve functional strength for independent gait, increased standing tolerance and increased ADL ability.    Time 8    Period Weeks    Status New                 Plan - 01/28/20 1111    Clinical Impression Statement Continued with static and dynamic balance training. Pt has intermittent difficulty with following cues for exercises, suspect mostly  HOH issues. Pt requires reminders to attempt interventions hands free. Introduced cable resisted multiplanar wlaking this date with good tolerance overall. Pt requires intermittent minA for assist with LOB recovery and righting, but less assist this date than in prior sessiosn.     Stability/Clinical Decision Making Stable/Uncomplicated    Clinical Decision Making Low    Rehab Potential Good    PT Frequency 2x / week    PT Duration 8 weeks    PT Treatment/Interventions Patient/family education;Neuromuscular re-education;Therapeutic activities;Gait training;Therapeutic exercise;Balance training    PT Next Visit Plan ankle strengthening, balance training    PT Home Exercise Plan heel raises with 2 sec hold    Consulted and Agree with Plan of Care Patient;Family member/caregiver           Patient will benefit from skilled therapeutic intervention in order to improve the following deficits and impairments:  Decreased activity tolerance, Decreased endurance, Decreased strength, Decreased balance, Decreased mobility, Difficulty walking  Visit Diagnosis: Muscle weakness (generalized)  Other abnormalities of gait and mobility     Problem List Patient Active Problem List   Diagnosis Date Noted  . Acute CVA (cerebrovascular accident) ([place]) 11/11/2018   11:36 AM, 01/28/20 Etta Grandchild, PT, DPT Physical Therapist - [place] [phone]     Etta Grandchild 01/28/2020, 11:12 AM  [place] MAIN [facility] SERVICES [address] [place], [place], [address] Phone: [phone]   Fax:  [phone]  Name: Ronald Adkins MRN: [id number] Date of Birth: December 21, 1945

## 2020-02-04 ENCOUNTER — Other Ambulatory Visit: Payer: Self-pay

## 2020-02-04 ENCOUNTER — Ambulatory Visit: Payer: PPO | Admitting: Physical Therapy

## 2020-02-04 DIAGNOSIS — R2689 Other abnormalities of gait and mobility: Secondary | ICD-10-CM

## 2020-02-04 DIAGNOSIS — M6281 Muscle weakness (generalized): Secondary | ICD-10-CM

## 2020-02-04 NOTE — Therapy (Signed)
[place] MAIN [facility] SERVICES [address] [place], [place], [address] Phone: [phone]   Fax:  [phone]  Physical Therapy Treatment  Patient Details  Name: Ronald Adkins MRN: [id number] Date of Birth: [birth year] Referring Provider (PT): Jennings Books   Encounter Date: 02/04/2020   PT End of Session - 02/04/20 1111    Visit Number 8    Number of Visits 17    Date for PT Re-Evaluation 03/02/20    PT Start Time 1100    PT Stop Time 1145    PT Time Calculation (min) 45 min    Equipment Utilized During Treatment Gait belt    Activity Tolerance Patient tolerated treatment well;No increased pain    Behavior During Therapy [facility] for tasks assessed/performed           Past Medical History:  Diagnosis Date   Dementia ([place])    Thyroid disease     Past Surgical History:  Procedure Laterality Date   TEE WITHOUT CARDIOVERSION N/A 11/14/2018   Procedure: TRANSESOPHAGEAL ECHOCARDIOGRAM (TEE);  Surgeon: Corey Skains, MD;  Location: [facility] ORS;  Service: Cardiovascular;  Laterality: N/A;    There were no vitals filed for this visit.   Subjective Assessment - 02/04/20 1105    Subjective Patient came back from the beach for the holidays and denies of any falls. He denies of any aches or pains since last therapy session.    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 He was in the hospital for 5 days and then was DC home. He had HHPT for a few visits and was DC. He is inactive and sits in his recliner all day.    Currently in Pain? No/denies             Neuro Re-ed airex pad 6" step toe taps 12x each LE  airex FWD step up 1x10; 2airex FWD step-up  airex pad: lateral step up/down 6" step in each direction  airex: WBOS x eyes open x 3 sets of 30s airex: NBOS x eyes open x 3 sets of 30s  STS from chair, hands free 2x10 with Gr 3000 ball Standing heel raise 2x15, BUE support ad lib  Tandem walking on airex beam in // bars x 3  laps (no UE) Side stepping on airex beam in // bars x 3 laps (no UE)  TherEx:  Standing with #3lb ankle weight: CGA for stability BLE hip extension 2x10 bilat  BLE hip ABD 2x10 bilat BLE hip Flexion 2x10 bilat  seated LAQ 2x10 bilat   seated crossover hip adduction/abduction with foam beam between with #5, 2 x10bilat Seated marches 3 x 10 bilateral  Resisted side stepping with green tband around ankles in // bars with BUE support x 3 lengths each direction Step ups at stairs with UE support x 10 reps each leg x forward and sideways     Patient demonstrated good motivation throughout therapy session. Patient tolerated strengthening exercises with increased muscle fatigue with increased ankle weights today. Patient demonstrates fair+ standing dynamic balance exercises. He continues to require constant redirection of attention to complete exercises. Therapy will continue to progress patient on increasing BLE strength, decrease balance deficits, and improve functional activity tolerance to improve patient's quality of life.       PT Short Term Goals - 01/06/20 1411      PT SHORT TERM GOAL #1   Title Patient will be independent in home exercise program to improve strength/mobility for better  functional independence with ADLs.    Time 4    Period Weeks    Status New             PT Long Term Goals - 01/06/20 1415      PT LONG TERM GOAL #1   Title Patient (> 61 years old) will complete five times sit to stand test in < 15 seconds indicating an increased LE strength and improved balance.    Time 8    Period Weeks    Status New    Target Date 03/02/20      PT LONG TERM GOAL #2   Title Patient will increase Berg Balance score by > 6 points to demonstrate decreased fall risk during functional activities.    Time 8    Period Weeks    Status New    Target Date 03/02/20      PT LONG TERM GOAL #3   Title Patient will increase BLE gross ankle strength to 4+/5 as to improve  functional strength for independent gait, increased standing tolerance and increased ADL ability.    Time 8    Period Weeks    Status New                Patient will benefit from skilled therapeutic intervention in order to improve the following deficits and impairments:  Decreased activity tolerance, Decreased endurance, Decreased strength, Decreased balance, Decreased mobility, Difficulty walking  Visit Diagnosis: Muscle weakness (generalized)  Other abnormalities of gait and mobility     Problem List Patient Active Problem List   Diagnosis Date Noted   Acute CVA (cerebrovascular accident) ([place]) 11/11/2018   Karl Luke PT, DPT [name] 02/04/2020, 1:06 PM  [place] MAIN [facility] SERVICES [address] [place], [place], [address] Phone: [phone]   Fax:  [phone]  Name: DONLEY HARLAND MRN: [id number] Date of Birth: [birth year]

## 2020-02-06 ENCOUNTER — Other Ambulatory Visit: Payer: Self-pay

## 2020-02-06 ENCOUNTER — Ambulatory Visit: Payer: PPO | Attending: Neurology

## 2020-02-06 VITALS — BP 118/86 | HR 95

## 2020-02-06 DIAGNOSIS — M6281 Muscle weakness (generalized): Secondary | ICD-10-CM | POA: Diagnosis not present

## 2020-02-06 DIAGNOSIS — R2689 Other abnormalities of gait and mobility: Secondary | ICD-10-CM | POA: Insufficient documentation

## 2020-02-06 NOTE — Therapy (Signed)
[place] MAIN [facility] SERVICES [address] [place], [place], [address] Phone: [phone]   Fax:  [phone]  Physical Therapy Treatment/Reassessment  Patient Details  Name: Ronald Adkins MRN: [id number] Date of Birth: 1945-05-05 Referring Provider (PT): Jennings Books   Encounter Date: 02/06/2020   PT End of Session - 02/06/20 1006    Visit Number 9    Number of Visits 17    Date for PT Re-Evaluation 03/02/20    Authorization Type [employer] [facility] guidelines)    Authorization Time Period 01/06/20-03/02/20    PT Start Time 0933    PT Stop Time 1013    PT Time Calculation (min) 40 min    Activity Tolerance Patient tolerated treatment well;No increased pain;Patient limited by fatigue    Behavior During Therapy [facility] for tasks assessed/performed           Past Medical History:  Diagnosis Date  . Dementia ([place])   . Thyroid disease     Past Surgical History:  Procedure Laterality Date  . TEE WITHOUT CARDIOVERSION N/A 11/14/2018   Procedure: TRANSESOPHAGEAL ECHOCARDIOGRAM (TEE);  Surgeon: Corey Skains, MD;  Location: [facility] ORS;  Service: Cardiovascular;  Laterality: N/A;    Vitals:   02/06/20 0945  BP: 118/86  Pulse: 95  SpO2: 96%     Subjective Assessment - 02/06/20 0940    Subjective Pt reports he's not sure why he was referred to PT, but he does endorse improved ease of AMB, improved confidence in balance.    Pertinent History Jaydien Panepinto is a 74yoM referred to OPPT for balance training s/p fall at home. Pt is follow as an outpatient by neurology s/p CVA and cardiology s/p arythmia, AAA, AVS, DOE. Pt admitted to [facility] on 11/11/18 c diplopia, Lt facial droop, MRI revealing of multifocal infarcts, including 74m Rt thalamic infarct, Rt occipital lobe, Lt temporal lobe, Rt parietal white matter. Acute evaluation in hospital demonstrated no acute changes to strength, sensation, proprioception, or coordination at that  time. PMH includes PrCA,, mixed dementia, AVS, AAA, left RCR, LLE arthroplasty.    Currently in Pain? No/denies              [facility] 02/06/20 0001      Observation/Other Assessments   Focus on Therapeutic Outcomes (FOTO)  --   None taken at eval     ROM / Strength   AROM / PROM / Strength Strength      Strength   Strength Assessment Site Ankle    Right/Left Ankle Right;Left    Right Ankle Plantar Flexion --   20x single heel raises, some height loss after 10x   Left Ankle Plantar Flexion --   20x single heel raises, some height loss after 15x     Transfers   Five time sit to stand comments  11.54sec (hands free)   19.5sec at eval (11/1)     Berg Balance Test   Sit to Stand Able to stand without using hands and stabilize independently    Standing Unsupported Able to stand safely 2 minutes    Sitting with Back Unsupported but Feet Supported on Floor or Stool Able to sit safely and securely 2 minutes    Stand to Sit Sits safely with minimal use of hands    Transfers Able to transfer safely, minor use of hands    Standing Unsupported with Eyes Closed Able to stand 10 seconds safely    Standing Unsupported with  Feet Together Able to place feet together independently and stand 1 minute safely    From Standing, Reach Forward with Outstretched Arm Can reach confidently >25 cm (10")    From Standing Position, Pick up Object from [place] to pick up shoe safely and easily    From Standing Position, Turn to Look Behind Over each Shoulder Looks behind from both sides and weight shifts well    Turn 360 Degrees Able to turn 360 degrees safely in 4 seconds or less    Standing Unsupported, Alternately Place Feet on Step/Stool Able to stand independently and complete 8 steps >20 seconds    Standing Unsupported, One Foot in Front Able to plae foot ahead of the other independently and hold 30 seconds    Standing on One Leg Tries to lift leg/unable to hold 3 seconds but remains  standing independently    Total Score 51            Intervention this date:   01/01/20: 5.3cm AAA (maintain SBP <140s per concensus guidelines)  118/86 96%; 95 bpm Clock draw test  tandem stance balance 10x10 sec bilat (at // bars, minguard assist for safety) Single leg heel raises 1x20 bilat Nustep: Seat 8, Arms 9, Level 2 x 7 minutes, cued ot maintain SPM > 70        PT Short Term Goals - 02/06/20 9892      PT SHORT TERM GOAL #1   Title Patient will be independent in home exercise program to improve strength/mobility for better functional independence with ADLs.    Time 4    Period Weeks    Status Not Met    Target Date 02/06/20             PT Long Term Goals - 02/06/20 0958      PT LONG TERM GOAL #1   Title Patient (> 67 years old) will complete five times sit to stand test in < 15 seconds indicating an increased LE strength and improved balance.    Baseline 19sec at evaluation; 12sec on 12/2    Time 8    Period Weeks    Status Achieved    Target Date 03/02/20      PT LONG TERM GOAL #2   Title Patient will increase Berg Balance score by > 6 points to demonstrate decreased fall risk during functional activities.    Baseline 51 on 12/1 (46 at eval)    Time 8    Period Weeks    Status Achieved    Target Date 03/02/20      PT LONG TERM GOAL #3   Title Patient will increase BLE gross ankle strength to 4+/5 as to improve functional strength for independent gait, increased standing tolerance and increased ADL ability.    Baseline SLS heel raises 20x bilat (less vertical excursion on right)    Time 8    Period Weeks    Status On-going    Target Date 03/04/20      PT LONG TERM GOAL #4   Title Pt will tolerate 10 minutes Nustep, SPM >70, >Level 2 in order to address cardiorepsiratory fitness and improve dyspnea on exertion in IADL/leisure performance.    Baseline DOE with AMB outside of home    Time 8    Period Weeks    Status New    Target Date 03/04/20                  Plan - 02/06/20  1007    Clinical Impression Statement Reassessment this date: significant improvement in 5xSTS, BBT, and ankle strength, although ankle strength still needs improvement to meet goal. BBT has met goal, but still indicates mild falls risk. No walk test performed today. Clock draw test given to establish baseline given misxed dementia diagnosis. Pt continues to progress toward goals overall. BP remains in appropriate range for management of >5.0cm AAA. Updated LT goals to include cardiac limitations in fitness interefering with participation in IADL and leisure activity.    Personal Factors and Comorbidities Age;Comorbidity 1;Comorbidity 2    Comorbidities AAA, mixed dementia    Stability/Clinical Decision Making Stable/Uncomplicated    Clinical Decision Making Low    Rehab Potential Good    PT Frequency 2x / week    PT Duration 8 weeks    PT Treatment/Interventions Patient/family education;Neuromuscular re-education;Therapeutic activities;Gait training;Therapeutic exercise;Balance training    PT Next Visit Plan 6MWT, 10th visit note; progress nustep to 8 minutes, single leg heel raises, conitnue with tandem stance balance training.    PT Home Exercise Plan has not been performing, but walks occasionally with granddaughter    Consulted and Agree with Plan of Care Patient           Patient will benefit from skilled therapeutic intervention in order to improve the following deficits and impairments:  Decreased activity tolerance, Decreased endurance, Decreased strength, Decreased balance, Decreased mobility, Difficulty walking  Visit Diagnosis: Muscle weakness (generalized)  Other abnormalities of gait and mobility     Problem List Patient Active Problem List   Diagnosis Date Noted  . Acute CVA (cerebrovascular accident) ([place]) 11/11/2018   10:19 AM, 02/06/20 Etta Grandchild, PT, DPT Physical Therapist - [facility]   Outpatient Physical [place] [phone]     Etta Grandchild 02/06/2020, 10:17 AM  [place] MAIN [facility] SERVICES [address] [place], [place], [address] Phone: [phone]   Fax:  [phone]  Name: Ronald Adkins MRN: [id number] Date of Birth: 1946-02-03

## 2020-02-11 ENCOUNTER — Other Ambulatory Visit: Payer: Self-pay

## 2020-02-11 ENCOUNTER — Ambulatory Visit: Payer: PPO | Admitting: Physical Therapy

## 2020-02-11 DIAGNOSIS — M6281 Muscle weakness (generalized): Secondary | ICD-10-CM | POA: Diagnosis not present

## 2020-02-11 DIAGNOSIS — R2689 Other abnormalities of gait and mobility: Secondary | ICD-10-CM

## 2020-02-11 NOTE — Therapy (Addendum)
[place] MAIN [facility] SERVICES [address] [place], [place], [address] Phone: [phone]   Fax:  [phone]  Physical Therapy Treatment/Progress Note  Dates of reporting period  01/06/2020   to   02/11/2020  Patient Details  Name: Ronald Adkins MRN: [id number] Date of Birth: 1945/06/19 Referring Provider (PT): Jennings Books   Encounter Date: 02/11/2020   PT End of Session - 02/11/20 1106    Visit Number 10    Number of Visits 17    Date for PT Re-Evaluation 03/02/20    Authorization Type [employer] [facility] guidelines)    Authorization Time Period 01/06/20-03/02/20    PT Start Time 1100    PT Stop Time 1145    PT Time Calculation (min) 45 min    Equipment Utilized During Treatment Gait belt    Activity Tolerance Patient tolerated treatment well;No increased pain;Patient limited by fatigue    Behavior During Therapy [facility] for tasks assessed/performed           Past Medical History:  Diagnosis Date  . Dementia ([place])   . Thyroid disease     Past Surgical History:  Procedure Laterality Date  . TEE WITHOUT CARDIOVERSION N/A 11/14/2018   Procedure: TRANSESOPHAGEAL ECHOCARDIOGRAM (TEE);  Surgeon: Corey Skains, MD;  Location: [facility] ORS;  Service: Cardiovascular;  Laterality: N/A;    There were no vitals filed for this visit.    Neuro Re-ed airex pad 6" step toe taps 12x each LE  airex FWD step up 1x10; 2airex FWD step-up  airex pad: lateral step up/down 6" step in each direction  airex: WBOS x eyes open x 3 sets of 30s airex: NBOS x eyes open x 3 sets of 30s  STS from chair, hands free 2x10 with Gr 3000 ball Standing heel raise 2x15, BUE support ad lib  Tandem walking on airex beam in // bars x 3 laps (no UE) Side stepping on airex beam in // bars x 3 laps (no UE)  TherEx:  Standing with #4lb ankle weight: CGA for stability BLE hip extension 2x10 bilat  BLE hip ABD 2x10 bilat BLE hip Flexion 2x10 bilat   seated LAQ 2x10 bilat   seated crossover hip adduction/abduction with foam beam between with #4, 2 x10bilat Seated marches 3 x 10 bilateral  Resisted side stepping with green tband around ankles in // bars with BUE support x 3 lengths each direction Step ups at stairs with UE support x 10 reps each leg x forward and sideways           PT Short Term Goals - 02/06/20 2878      PT SHORT TERM GOAL #1   Title Patient will be independent in home exercise program to improve strength/mobility for better functional independence with ADLs.    Time 4    Period Weeks    Status Not Met    Target Date 02/06/20             PT Long Term Goals - 02/06/20 0958      PT LONG TERM GOAL #1   Title Patient (> 31 years old) will complete five times sit to stand test in < 15 seconds indicating an increased LE strength and improved balance.    Baseline 19sec at evaluation; 12sec on 12/2    Time 8    Period Weeks    Status Achieved    Target Date 03/02/20      PT LONG TERM GOAL #  2   Title Patient will increase Berg Balance score by > 6 points to demonstrate decreased fall risk during functional activities.    Baseline 51 on 12/1 (46 at eval)    Time 8    Period Weeks    Status Achieved    Target Date 03/02/20      PT LONG TERM GOAL #3   Title Patient will increase BLE gross ankle strength to 4+/5 as to improve functional strength for independent gait, increased standing tolerance and increased ADL ability.    Baseline SLS heel raises 20x bilat (less vertical excursion on right)    Time 8    Period Weeks    Status On-going    Target Date 03/04/20      PT LONG TERM GOAL #4   Title Pt will tolerate 10 minutes Nustep, SPM >70, >Level 2 in order to address cardiorepsiratory fitness and improve dyspnea on exertion in IADL/leisure performance.    Baseline DOE with AMB outside of home    Time 8    Period Weeks    Status New    Target Date 03/04/20                Plan - 02/13/20  1004    Clinical Impression Statement Progressed patient today with increased ankle weights with minimal increase in pain with activity. Patient continues to have muscle fatigue with activity as evidenced by repeated seated therapeutic rest breaks. Therapist provided verbal cues to avoid compensation of spine when completing standing hip exercises. Patient would benefit from skilled physical therapy in order to increase BLE strength, improve balance deficits, and increase functional activity tolerance to improve patient's quality of life.    Personal Factors and Comorbidities Age;Comorbidity 1;Comorbidity 2    Comorbidities AAA, mixed dementia    Stability/Clinical Decision Making Stable/Uncomplicated    Rehab Potential Good    PT Frequency 2x / week    PT Duration 8 weeks    PT Treatment/Interventions Patient/family education;Neuromuscular re-education;Therapeutic activities;Gait training;Therapeutic exercise;Balance training    PT Next Visit Plan single leg heel raises, conitnue with tandem stance balance training.    PT Home Exercise Plan has not been performing, but walks occasionally with granddaughter    Consulted and Agree with Plan of Care Patient                  Patient will benefit from skilled therapeutic intervention in order to improve the following deficits and impairments:     Visit Diagnosis: Muscle weakness (generalized)  Other abnormalities of gait and mobility     Problem List Patient Active Problem List   Diagnosis Date Noted  . Acute CVA (cerebrovascular accident) [facility]) 11/11/2018    Ronald Adkins 02/11/2020, 11:09 AM  [place] MAIN [facility] SERVICES [address] [place], [place], [address] Phone: [phone]   Fax:  [phone]  Name: Ronald Adkins MRN: [id number] Date of Birth: 1945-04-02

## 2020-02-13 ENCOUNTER — Other Ambulatory Visit: Payer: Self-pay

## 2020-02-13 ENCOUNTER — Ambulatory Visit: Payer: PPO | Admitting: Physical Therapy

## 2020-02-13 DIAGNOSIS — R2689 Other abnormalities of gait and mobility: Secondary | ICD-10-CM

## 2020-02-13 DIAGNOSIS — M6281 Muscle weakness (generalized): Secondary | ICD-10-CM | POA: Diagnosis not present

## 2020-02-13 NOTE — Therapy (Signed)
[place] MAIN [facility] SERVICES [address] [place], [place], [address] Phone: [phone]   Fax:  [phone]  Physical Therapy Treatment  Patient Details  Name: Ronald Adkins MRN: [id number] Date of Birth: [birth year] Referring Provider (PT): Jennings Books   Encounter Date: 02/13/2020   PT End of Session - 02/13/20 1105    Visit Number 11    Number of Visits 17    Date for PT Re-Evaluation 03/02/20    Authorization Type [employer] [facility] guidelines)    Authorization Time Period 01/06/20-03/02/20    PT Start Time 1100    PT Stop Time 1145    PT Time Calculation (min) 45 min    Equipment Utilized During Treatment Gait belt    Activity Tolerance Patient tolerated treatment well;No increased pain;Patient limited by fatigue    Behavior During Therapy [facility] for tasks assessed/performed           Past Medical History:  Diagnosis Date  . Dementia ([place])   . Thyroid disease     Past Surgical History:  Procedure Laterality Date  . TEE WITHOUT CARDIOVERSION N/A 11/14/2018   Procedure: TRANSESOPHAGEAL ECHOCARDIOGRAM (TEE);  Surgeon: Corey Skains, MD;  Location: [facility] ORS;  Service: Cardiovascular;  Laterality: N/A;    There were no vitals filed for this visit.   Subjective Assessment - 02/13/20 1103    Subjective No new symptoms or pain noted at today's reporting period.    Pertinent History Ronald Adkins is a 88yoM referred to OPPT for balance training s/p fall at home. Pt is follow as an outpatient by neurology s/p CVA and cardiology s/p arythmia, AAA, AVS, DOE. Pt admitted to [facility] on 11/11/18 c diplopia, Lt facial droop, MRI revealing of multifocal infarcts, including 45m Rt thalamic infarct, Rt occipital lobe, Lt temporal lobe, Rt parietal white matter. Acute evaluation in hospital demonstrated no acute changes to strength, sensation, proprioception, or coordination at that time. PMH includes PrCA,, mixed dementia, AVS, AAA,  left RCR, LLE arthroplasty.    Currently in Pain? No/denies             Neuro Re-ed airex pad 6" step toe taps 12x each LE  airex FWD step up 1x10; 2airex FWD step-up  airex pad: lateral step up/down 6" step in each direction  airex: WBOS x eyes open x 3 sets of 30s airex: NBOS x eyes open x 3 sets of 30s Airex: ball throw at basketball hoop x 2 sets  STS from chair, hands free 2x10 with Gr 3000 ball Standing heel raise 2x15, BUE support ad lib  Tandem walking on airex beam in // bars x 3 laps (no UE) Tandem standing on semi foam roll x 3 30s Side stepping on airex beam in // bars x 3 laps (no UE)  TherEx:  Standing with #4lb ankle weight: CGA for stability BLE hip extension 2x10 bilat  BLE hip ABD 2x10 bilat BLE hip Flexion 2x10 bilat  seated LAQ 2x10 bilat   seated crossover hip adduction/abduction with foam beam between with #4, 2 x10bilat Seated marches 3 x 10 bilateral  Resisted side stepping with green tband around ankles in // bars with BUE support x 3 lengths each direction Step ups at stairs with UE support x 10 reps each leg x forward and sideways                PT Short Term Goals - 02/06/20 00973     PT SHORT  TERM GOAL #1   Title Patient will be independent in home exercise program to improve strength/mobility for better functional independence with ADLs.    Time 4    Period Weeks    Status Not Met    Target Date 02/06/20             PT Long Term Goals - 02/11/20 1001      PT LONG TERM GOAL #1   Title Patient (> 42 years old) will complete five times sit to stand test in < 15 seconds indicating an increased LE strength and improved balance.    Baseline 19sec at evaluation; 12sec on 12/2    Time 8    Period Weeks    Status Achieved      PT LONG TERM GOAL #2   Title Patient will increase Berg Balance score by > 6 points to demonstrate decreased fall risk during functional activities.    Baseline 51 on 12/1 (46 at eval)    Time 8     Period Weeks    Status Achieved      PT LONG TERM GOAL #3   Title Patient will increase BLE gross ankle strength to 4+/5 as to improve functional strength for independent gait, increased standing tolerance and increased ADL ability.    Baseline SLS heel raises 20x bilat (less vertical excursion on right)    Time 8    Period Weeks    Status On-going      PT LONG TERM GOAL #4   Title Pt will tolerate 10 minutes Nustep, SPM >70, >Level 2 in order to address cardiorepsiratory fitness and improve dyspnea on exertion in IADL/leisure performance.    Baseline DOE with AMB outside of home    Time 8    Period Weeks    Status On-going                 Plan - 02/13/20 1106    Clinical Impression Statement Progressed patient's balance exercises with emphasis on dynamic standing on airex with good postural control. Patient continues to have postural swaying with eyes closed due to decreased proprioception. Patient demonstrated increased SOB with seated exercises with HR monitored as 122 bpm; HR was monitored after seated rest break as 110 bpm after 5 minutes. Therapy will continue to focus on improving strength, mobility, and balance deficits in order to improve patient's quality of life.    Personal Factors and Comorbidities Age;Comorbidity 1;Comorbidity 2    Comorbidities AAA, mixed dementia    Stability/Clinical Decision Making Stable/Uncomplicated    Rehab Potential Good    PT Frequency 2x / week    PT Duration 8 weeks    PT Treatment/Interventions Patient/family education;Neuromuscular re-education;Therapeutic activities;Gait training;Therapeutic exercise;Balance training    PT Next Visit Plan single leg heel raises, conitnue with tandem stance balance training.    PT Home Exercise Plan has not been performing, but walks occasionally with granddaughter    Consulted and Agree with Plan of Care Patient           Patient will benefit from skilled therapeutic intervention in order to  improve the following deficits and impairments:  Decreased activity tolerance,Decreased endurance,Decreased strength,Decreased balance,Decreased mobility,Difficulty walking  Visit Diagnosis: Muscle weakness (generalized)  Other abnormalities of gait and mobility     Problem List Patient Active Problem List   Diagnosis Date Noted  . Acute CVA (cerebrovascular accident) [facility]) 11/11/2018   Karl Luke PT, DPT [name] 02/13/2020, 12:55 PM  [place]  [facility] MAIN [facility] SERVICES [place], [place], [address] Phone: [phone]   Fax:  [phone]  Name: Ronald Adkins MRN: [id number] Date of Birth: Apr 30, 1945

## 2020-02-14 DIAGNOSIS — Q231 Congenital insufficiency of aortic valve: Secondary | ICD-10-CM | POA: Diagnosis not present

## 2020-02-14 DIAGNOSIS — Q230 Congenital stenosis of aortic valve: Secondary | ICD-10-CM | POA: Diagnosis not present

## 2020-02-18 ENCOUNTER — Other Ambulatory Visit: Payer: Self-pay

## 2020-02-18 ENCOUNTER — Ambulatory Visit: Payer: PPO | Admitting: Physical Therapy

## 2020-02-18 DIAGNOSIS — R2689 Other abnormalities of gait and mobility: Secondary | ICD-10-CM

## 2020-02-18 DIAGNOSIS — M6281 Muscle weakness (generalized): Secondary | ICD-10-CM

## 2020-02-18 NOTE — Therapy (Signed)
[place] MAIN [facility] SERVICES [address] [place], [place], [address] Phone: [phone]   Fax:  [phone]  Physical Therapy Treatment  Patient Details  Name: GARYSON STELLY MRN: [id number] Date of Birth: 1945/03/25 Referring Provider (PT): Jennings Books   Encounter Date: 02/18/2020   PT End of Session - 02/18/20 1108    Visit Number 12    Number of Visits 17    Date for PT Re-Evaluation 03/02/20    Authorization Type [employer] [facility] guidelines)    Authorization Time Period 01/06/20-03/02/20    PT Start Time 1105    PT Stop Time 1150    PT Time Calculation (min) 45 min    Equipment Utilized During Treatment Gait belt    Activity Tolerance Patient tolerated treatment well;No increased pain;Patient limited by fatigue    Behavior During Therapy [facility] for tasks assessed/performed           Past Medical History:  Diagnosis Date   Dementia ([place])    Thyroid disease     Past Surgical History:  Procedure Laterality Date   TEE WITHOUT CARDIOVERSION N/A 11/14/2018   Procedure: TRANSESOPHAGEAL ECHOCARDIOGRAM (TEE);  Surgeon: Corey Skains, MD;  Location: [facility] ORS;  Service: Cardiovascular;  Laterality: N/A;    There were no vitals filed for this visit.   Subjective Assessment - 02/18/20 1110    Subjective Patient reports that he had a stress test last week and has a cardiology appointment this week for a follow up. He denies of any falls or pain since last therapy session.    Pertinent History Arad Burston is a 74yoM referred to OPPT for balance training s/p fall at home. Pt is follow as an outpatient by neurology s/p CVA and cardiology s/p arythmia, AAA, AVS, DOE. Pt admitted to [facility] on 11/11/18 c diplopia, Lt facial droop, MRI revealing of multifocal infarcts, including 51m Rt thalamic infarct, Rt occipital lobe, Lt temporal lobe, Rt parietal white matter. Acute evaluation in hospital demonstrated no acute changes to  strength, sensation, proprioception, or coordination at that time. PMH includes PrCA,, mixed dementia, AVS, AAA, left RCR, LLE arthroplasty.    Currently in Pain? No/denies              Neuro Re-ed airex pad 6" step toe taps 12x each LE  airex FWD step up 1x10; 2airex FWD step-up  airex pad: lateral step up/down 6" step in each direction  airex: WBOS x eyes open x 3 sets of 30s airex: NBOS x eyes open x 3 sets of 30s  STS from chair, hands free 2x10 with Gr 3000 ball Standing heel raise 2x15, BUE support ad lib  Tandem walking on airex beam  x 3 laps (no UE) Tandem standing on semi foam roll x 3 30s Side stepping on airex beam x 3 laps (no UE)  TherEx:  Standing with #4lb ankle weight: CGA for stability BLE hip extension 2x10 bilat  BLE hip ABD 2x10 bilat BLE hip Flexion 2x10 bilat  seated LAQ 2x10 bilat   seated crossover hip adduction/abduction with foam beam between with #4, 2 x10bilat Seated marches 3 x 10 bilateral  Resisted side stepping with green tband around ankles in // bars with BUE support x 3 lengths each direction Step ups at stairs with UE support x 10 reps each leg x forward and sideways   Precor leg press BLE extension/flexion x 45# x 15 reps  PT Short Term Goals - 02/06/20 0254      PT SHORT TERM GOAL #1   Title Patient will be independent in home exercise program to improve strength/mobility for better functional independence with ADLs.    Time 4    Period Weeks    Status Not Met    Target Date 02/06/20             PT Long Term Goals - 02/11/20 1001      PT LONG TERM GOAL #1   Title Patient (> 72 years old) will complete five times sit to stand test in < 15 seconds indicating an increased LE strength and improved balance.    Baseline 19sec at evaluation; 12sec on 12/2    Time 8    Period Weeks    Status Achieved      PT LONG TERM GOAL #2   Title Patient will increase Berg Balance score by > 6  points to demonstrate decreased fall risk during functional activities.    Baseline 51 on 12/1 (46 at eval)    Time 8    Period Weeks    Status Achieved      PT LONG TERM GOAL #3   Title Patient will increase BLE gross ankle strength to 4+/5 as to improve functional strength for independent gait, increased standing tolerance and increased ADL ability.    Baseline SLS heel raises 20x bilat (less vertical excursion on right)    Time 8    Period Weeks    Status On-going      PT LONG TERM GOAL #4   Title Pt will tolerate 10 minutes Nustep, SPM >70, >Level 2 in order to address cardiorepsiratory fitness and improve dyspnea on exertion in IADL/leisure performance.    Baseline DOE with AMB outside of home    Time 8    Period Weeks    Status On-going                 Plan - 02/18/20 1730    Clinical Impression Statement Patient demonstrates good motivation throughout therapy session. He tolerated strengthening exercises with minimal to no increase in pain. Therapist provided verbal cues to avoid medial diving of bilateral knees when completing the leg press going into flexion. Patient demonstrates improvement in static unsupported balance exercises with minimal postural swaying. Therapy will progress patient in balance and strengthening exercises to improve patient's functional activity tolerance.    Personal Factors and Comorbidities Age;Comorbidity 1;Comorbidity 2    Comorbidities AAA, mixed dementia    Stability/Clinical Decision Making Stable/Uncomplicated    Rehab Potential Good    PT Frequency 2x / week    PT Duration 8 weeks    PT Treatment/Interventions Patient/family education;Neuromuscular re-education;Therapeutic activities;Gait training;Therapeutic exercise;Balance training    PT Next Visit Plan single leg heel raises, conitnue with tandem stance balance training.    PT Home Exercise Plan has not been performing, but walks occasionally with granddaughter    Consulted and  Agree with Plan of Care Patient           Patient will benefit from skilled therapeutic intervention in order to improve the following deficits and impairments:  Decreased activity tolerance,Decreased endurance,Decreased strength,Decreased balance,Decreased mobility,Difficulty walking  Visit Diagnosis: Other abnormalities of gait and mobility  Muscle weakness (generalized)     Problem List Patient Active Problem List   Diagnosis Date Noted   Acute CVA (cerebrovascular accident) ([place]) 11/11/2018   Karl Luke PT, DPT [name] 02/18/2020, 5:34  PM  [place] MAIN [facility] SERVICES [address] [place], [place], [address] Phone: [phone]   Fax:  [phone]  Name: NOHLAN BURDIN MRN: [id number] Date of Birth: 19-Sep-1945

## 2020-02-20 ENCOUNTER — Ambulatory Visit: Payer: PPO

## 2020-02-20 DIAGNOSIS — I679 Cerebrovascular disease, unspecified: Secondary | ICD-10-CM | POA: Diagnosis not present

## 2020-02-20 DIAGNOSIS — Q231 Congenital insufficiency of aortic valve: Secondary | ICD-10-CM | POA: Diagnosis not present

## 2020-02-20 DIAGNOSIS — Q230 Congenital stenosis of aortic valve: Secondary | ICD-10-CM | POA: Diagnosis not present

## 2020-02-20 DIAGNOSIS — I712 Thoracic aortic aneurysm, without rupture: Secondary | ICD-10-CM | POA: Diagnosis not present

## 2020-02-20 DIAGNOSIS — Z01818 Encounter for other preprocedural examination: Secondary | ICD-10-CM | POA: Diagnosis not present

## 2020-02-25 ENCOUNTER — Other Ambulatory Visit: Payer: Self-pay

## 2020-02-25 ENCOUNTER — Ambulatory Visit: Payer: PPO | Admitting: Physical Therapy

## 2020-02-25 DIAGNOSIS — M6281 Muscle weakness (generalized): Secondary | ICD-10-CM | POA: Diagnosis not present

## 2020-02-25 DIAGNOSIS — R2689 Other abnormalities of gait and mobility: Secondary | ICD-10-CM

## 2020-02-25 NOTE — Therapy (Signed)
[place] MAIN [facility] SERVICES [address] [place], [place], [address] Phone: [phone]   Fax:  [phone]  Physical Therapy Treatment  Patient Details  Name: Ronald Adkins MRN: [id number] Date of Birth: [birth year] Referring Provider (PT): Jennings Books   Encounter Date: 02/25/2020   PT End of Session - 02/25/20 1106    Visit Number 13    Number of Visits 17    Date for PT Re-Evaluation 03/02/20    Authorization Type [employer] [facility] guidelines)    Authorization Time Period 01/06/20-03/02/20    PT Start Time 1100    PT Stop Time 1145    PT Time Calculation (min) 45 min    Equipment Utilized During Treatment Gait belt    Activity Tolerance Patient tolerated treatment well;No increased pain;Patient limited by fatigue    Behavior During Therapy [facility] for tasks assessed/performed           Past Medical History:  Diagnosis Date  . Dementia ([place])   . Thyroid disease     Past Surgical History:  Procedure Laterality Date  . TEE WITHOUT CARDIOVERSION N/A 11/14/2018   Procedure: TRANSESOPHAGEAL ECHOCARDIOGRAM (TEE);  Surgeon: Corey Skains, MD;  Location: [facility] ORS;  Service: Cardiovascular;  Laterality: N/A;    There were no vitals filed for this visit.   Subjective Assessment - 02/25/20 1313    Subjective Patient reports that he will be getting heart surgery due to treatment options of aortic valve stenosis. Patient denies of any falls or injuries since last therapy session.    Pertinent History Ronald Adkins is a 34yoM referred to OPPT for balance training s/p fall at home. Pt is follow as an outpatient by neurology s/p CVA and cardiology s/p arythmia, AAA, AVS, DOE. Pt admitted to [facility] on 11/11/18 c diplopia, Lt facial droop, MRI revealing of multifocal infarcts, including 38m Rt thalamic infarct, Rt occipital lobe, Lt temporal lobe, Rt parietal white matter. Acute evaluation in hospital demonstrated no acute changes  to strength, sensation, proprioception, or coordination at that time. PMH includes PrCA,, mixed dementia, AVS, AAA, left RCR, LLE arthroplasty.    Currently in Pain? No/denies           Nustep x L1 x 5 mins   Airex beam walking x 3 laps Airex side steping on beam x 3 laps  airex: WBOS x eyes open x 3 sets of 30s airex: NBOS x eyes open x 3 sets of 30s Airex beam tandem stance x 3 sets of 30s each leg  Airex balloon toss x 2-3 mins at a time  Airex step ups x 10 reps each leg   Sit to stands x 10, x 7,        PT Short Term Goals - 02/06/20 05830     PT SHORT TERM GOAL #1   Title Patient will be independent in home exercise program to improve strength/mobility for better functional independence with ADLs.    Time 4    Period Weeks    Status Not Met    Target Date 02/06/20             PT Long Term Goals - 02/11/20 1001      PT LONG TERM GOAL #1   Title Patient (> 827years old) will complete five times sit to stand test in < 15 seconds indicating an increased LE strength and improved balance.    Baseline 19sec at evaluation; 12sec on 12/2  Time 8    Period Weeks    Status Achieved      PT LONG TERM GOAL #2   Title Patient will increase Berg Balance score by > 6 points to demonstrate decreased fall risk during functional activities.    Baseline 51 on 12/1 (46 at eval)    Time 8    Period Weeks    Status Achieved      PT LONG TERM GOAL #3   Title Patient will increase BLE gross ankle strength to 4+/5 as to improve functional strength for independent gait, increased standing tolerance and increased ADL ability.    Baseline SLS heel raises 20x bilat (less vertical excursion on right)    Time 8    Period Weeks    Status On-going      PT LONG TERM GOAL #4   Title Pt will tolerate 10 minutes Nustep, SPM >70, >Level 2 in order to address cardiorepsiratory fitness and improve dyspnea on exertion in IADL/leisure performance.    Baseline DOE with AMB outside of home     Time 8    Period Weeks    Status On-going                 Plan - 02/25/20 1544    Clinical Impression Statement Patient completed dynamic standing balance exercises with fair balance. Patient demonstrated mild postural swaying with reaching to the left due to decreased proprioception cues. Patient's SaO2 was >93% and HR 100-110 after every standing exercise. Patient demonstrates muscle fatigue with cardiovascular endurance traning due to decreased capacity to activity. Patient would benefit from PT services to increase strength, mobility, and functional activity status.    Personal Factors and Comorbidities Age;Comorbidity 1;Comorbidity 2    Comorbidities AAA, mixed dementia    Stability/Clinical Decision Making Stable/Uncomplicated    Rehab Potential Good    PT Frequency 2x / week    PT Duration 8 weeks    PT Treatment/Interventions Patient/family education;Neuromuscular re-education;Therapeutic activities;Gait training;Therapeutic exercise;Balance training    PT Next Visit Plan single leg heel raises, conitnue with tandem stance balance training.    PT Home Exercise Plan has not been performing, but walks occasionally with granddaughter    Consulted and Agree with Plan of Care Patient           Patient will benefit from skilled therapeutic intervention in order to improve the following deficits and impairments:  Decreased activity tolerance,Decreased endurance,Decreased strength,Decreased balance,Decreased mobility,Difficulty walking  Visit Diagnosis: Other abnormalities of gait and mobility  Muscle weakness (generalized)     Problem List Patient Active Problem List   Diagnosis Date Noted  . Acute CVA (cerebrovascular accident) [facility]) 11/11/2018   Karl Luke PT, DPT [name] 02/25/2020, 3:55 PM  [place] MAIN [facility] SERVICES [address] [place], [place], [address] Phone: [phone]   Fax:   [phone]  Name: Ronald Adkins MRN: [id number] Date of Birth: 14-Oct-1945

## 2020-02-27 ENCOUNTER — Ambulatory Visit: Payer: PPO | Admitting: Physical Therapy

## 2020-02-27 ENCOUNTER — Other Ambulatory Visit: Payer: Self-pay

## 2020-02-27 DIAGNOSIS — I350 Nonrheumatic aortic (valve) stenosis: Secondary | ICD-10-CM

## 2020-02-27 DIAGNOSIS — R2689 Other abnormalities of gait and mobility: Secondary | ICD-10-CM

## 2020-02-27 DIAGNOSIS — M6281 Muscle weakness (generalized): Secondary | ICD-10-CM

## 2020-02-27 NOTE — Therapy (Signed)
[place] MAIN [facility] SERVICES [address] [place], [place], [address] Phone: [phone]   Fax:  [phone]  Physical Therapy Discharge Summary  Patient Details  Name: Ronald Adkins MRN: [id number] Date of Birth: 1945/08/13 Referring Provider (PT): Jennings Books   Encounter Date: 02/27/2020    Past Medical History:  Diagnosis Date  . Dementia ([place])   . Thyroid disease     Past Surgical History:  Procedure Laterality Date  . TEE WITHOUT CARDIOVERSION N/A 11/14/2018   Procedure: TRANSESOPHAGEAL ECHOCARDIOGRAM (TEE);  Surgeon: Corey Skains, MD;  Location: [facility] ORS;  Service: Cardiovascular;  Laterality: N/A;    There were no vitals filed for this visit.   Subjective Assessment - 02/27/20 1120    Subjective Patient reports he feels better today than he has in a long time. He denies of any falls or new symptoms since last therapy session.    Pertinent History Mark Hassey is a 43yoM referred to OPPT for balance training s/p fall at home. Pt is follow as an outpatient by neurology s/p CVA and cardiology s/p arythmia, AAA, AVS, DOE. Pt admitted to [facility] on 11/11/18 c diplopia, Lt facial droop, MRI revealing of multifocal infarcts, including 66m Rt thalamic infarct, Rt occipital lobe, Lt temporal lobe, Rt parietal white matter. Acute evaluation in hospital demonstrated no acute changes to strength, sensation, proprioception, or coordination at that time. PMH includes PrCA,, mixed dementia, AVS, AAA, left RCR, LLE arthroplasty.    Currently in Pain? No/denies              Patient has reached his maximum rehab potential at this time with physical therapy as evidenced by meeting his short term and long term goals. Patient has improved his balance skills with his BMerrilee JanskyBalance score from 46/56 to 51/56 putting patient at a decreased fall risk. Patient exhibits improvement in his BLE ankle strength as his MMT score increased to 4+/5 with  increased amount of heel raises. Patient is highly motivated to maintain progress and apply his outcomes from therapy to his every day routine. Patient was encouraged to rejoin therapy if patient's quality of life or general health condition worsens.       PT Education - 02/27/20 1121    Education Details HEP: SLR, mini squats, heel raises    Person(s) Educated Patient    Methods Explanation;Handout    Comprehension Verbalized understanding;Returned demonstration            PT Short Term Goals - 02/27/20 1117      PT SHORT TERM GOAL #1   Title Patient will be independent in home exercise program to improve strength/mobility for better functional independence with ADLs.    Baseline Patient reports of inconsistency with HEP.    Time 4    Period Weeks    Status Partially Met    Target Date 02/06/20            PT Long Term Goals - 02/27/20 1115      PT LONG TERM GOAL #1   Title Patient (> 682years old) will complete five times sit to stand test in < 15 seconds indicating an increased LE strength and improved balance.    Baseline 19sec at evaluation; 12sec on 12/2    Time 8    Period Weeks    Status Achieved      PT LONG TERM GOAL #2   Title Patient will increase Berg Balance score by > 6 points to  demonstrate decreased fall risk during functional activities.    Baseline 51 on 12/1 (46 at eval)    Time 8    Period Weeks    Status Achieved      PT LONG TERM GOAL #3   Title Patient will increase BLE gross ankle strength to 4+/5 as to improve functional strength for independent gait, increased standing tolerance and increased ADL ability.    Baseline SLS heel raises 20x bilat (less vertical excursion on right), 12/23: BLE ankle strength 4+/5, completes heel raises >35 reps bilaterally    Time 8    Period Weeks    Status Achieved      PT LONG TERM GOAL #4   Title Pt will tolerate 10 minutes Nustep, SPM >70, >Level 2 in order to address cardiorepsiratory fitness and improve  dyspnea on exertion in IADL/leisure performance.    Baseline DOE with AMB outside of home, 12/23: 10 minutes Nustep, SPM 72, Level 3    Time 8    Period Weeks    Status Achieved                 Plan - 02/27/20 1204    Clinical Impression Statement Patient has reached his maximum rehab potential at this time with physical therapy as evidenced by meeting his short term and long term goals. Patient has improved his balance skills with his Merrilee Jansky Balance score from 46/56 to 51/56 putting patient at a decreased fall risk. Patient exhibits improvement in his BLE ankle strength as his MMT score increased to 4+/5 with increased amount of heel raises. Patient is highly motivated to maintain progress and apply his outcomes from therapy to his every day routine. Patient was encouraged to rejoin therapy if patient's quality of life or general health condition worsens.    Personal Factors and Comorbidities Age;Comorbidity 1;Comorbidity 2    Comorbidities AAA, mixed dementia    Stability/Clinical Decision Making Stable/Uncomplicated    Rehab Potential Good    PT Frequency 2x / week    PT Duration 8 weeks    PT Treatment/Interventions Patient/family education;Neuromuscular re-education;Therapeutic activities;Gait training;Therapeutic exercise;Balance training    PT Next Visit Plan Discharged    PT Home Exercise Plan HEP: mini squats, heel raises, SLR, standing hip abduction, walking around neighborhood    Consulted and Agree with Plan of Care Patient           Patient will benefit from skilled therapeutic intervention in order to improve the following deficits and impairments:  Decreased activity tolerance,Decreased endurance,Decreased strength,Decreased balance,Decreased mobility,Difficulty walking  Visit Diagnosis: Other abnormalities of gait and mobility  Muscle weakness (generalized)     Problem List Patient Active Problem List   Diagnosis Date Noted  . Aortic stenosis 02/27/2020  .  Acute CVA (cerebrovascular accident) [facility]) 11/11/2018   Karl Luke PT, DPT [name] 02/27/2020, 12:06 PM  [place] [facility] SERVICES [address] [place], [place], [address] Phone: [phone]   Fax:  [phone]  Name: DAMIEON ARMENDARIZ MRN: [id number] Date of Birth: 10-06-1945

## 2020-03-03 ENCOUNTER — Other Ambulatory Visit: Admission: RE | Admit: 2020-03-03 | Payer: PPO | Source: Ambulatory Visit

## 2020-03-03 ENCOUNTER — Ambulatory Visit: Payer: PPO

## 2020-03-05 ENCOUNTER — Ambulatory Visit: Payer: PPO

## 2020-03-06 ENCOUNTER — Other Ambulatory Visit: Payer: PPO

## 2020-03-09 ENCOUNTER — Other Ambulatory Visit
Admission: RE | Admit: 2020-03-09 | Discharge: 2020-03-09 | Disposition: A | Discharge status: Home / Self Care | Payer: PPO | Source: Ambulatory Visit | Attending: Internal Medicine | Admitting: Internal Medicine

## 2020-03-09 ENCOUNTER — Ambulatory Visit: Payer: PPO

## 2020-03-09 DIAGNOSIS — Z01812 Encounter for preprocedural laboratory examination: Secondary | ICD-10-CM | POA: Insufficient documentation

## 2020-03-09 DIAGNOSIS — Z20822 Contact with and (suspected) exposure to covid-19: Secondary | ICD-10-CM | POA: Insufficient documentation

## 2020-03-09 LAB — SARS CORONAVIRUS 2 (TAT 6-24 HRS): SARS Coronavirus 2: NEGATIVE

## 2020-03-10 ENCOUNTER — Ambulatory Visit
Admission: RE | Admit: 2020-03-10 | Discharge: 2020-03-10 | Disposition: A | Discharge status: Home / Self Care | Payer: PPO | Attending: Internal Medicine | Admitting: Internal Medicine

## 2020-03-10 ENCOUNTER — Encounter: Payer: Self-pay | Admitting: Internal Medicine

## 2020-03-10 ENCOUNTER — Encounter
Admission: RE | Disposition: A | Discharge status: Home / Self Care | Payer: Self-pay | Source: Home / Self Care | Attending: Internal Medicine

## 2020-03-10 DIAGNOSIS — I714 Abdominal aortic aneurysm, without rupture: Secondary | ICD-10-CM | POA: Insufficient documentation

## 2020-03-10 DIAGNOSIS — Z87891 Personal history of nicotine dependence: Secondary | ICD-10-CM | POA: Insufficient documentation

## 2020-03-10 DIAGNOSIS — Z79899 Other long term (current) drug therapy: Secondary | ICD-10-CM | POA: Insufficient documentation

## 2020-03-10 DIAGNOSIS — I350 Nonrheumatic aortic (valve) stenosis: Secondary | ICD-10-CM | POA: Diagnosis not present

## 2020-03-10 DIAGNOSIS — Z79890 Hormone replacement therapy (postmenopausal): Secondary | ICD-10-CM | POA: Diagnosis not present

## 2020-03-10 DIAGNOSIS — E785 Hyperlipidemia, unspecified: Secondary | ICD-10-CM | POA: Diagnosis not present

## 2020-03-10 DIAGNOSIS — I10 Essential (primary) hypertension: Secondary | ICD-10-CM | POA: Diagnosis not present

## 2020-03-10 DIAGNOSIS — R9439 Abnormal result of other cardiovascular function study: Secondary | ICD-10-CM

## 2020-03-10 DIAGNOSIS — I2510 Atherosclerotic heart disease of native coronary artery without angina pectoris: Secondary | ICD-10-CM | POA: Diagnosis not present

## 2020-03-10 HISTORY — PX: RIGHT/LEFT HEART CATH AND CORONARY ANGIOGRAPHY: CATH118266

## 2020-03-10 SURGERY — RIGHT/LEFT HEART CATH AND CORONARY ANGIOGRAPHY
Anesthesia: Moderate Sedation | Laterality: Bilateral

## 2020-03-10 MED ORDER — FENTANYL CITRATE (PF) 100 MCG/2ML IJ SOLN
INTRAMUSCULAR | Status: DC | PRN
  Administered 2020-03-10: 50 ug via INTRAVENOUS

## 2020-03-10 MED ORDER — MIDAZOLAM HCL 2 MG/2ML IJ SOLN
INTRAMUSCULAR | Status: AC
  Filled 2020-03-10: qty 2

## 2020-03-10 MED ORDER — SODIUM CHLORIDE 0.9 % WEIGHT BASED INFUSION
3.0000 mL/kg/h | INTRAVENOUS | Status: AC
  Administered 2020-03-10: 3 mL/kg/h via INTRAVENOUS

## 2020-03-10 MED ORDER — MIDAZOLAM HCL 2 MG/2ML IJ SOLN
INTRAMUSCULAR | Status: DC | PRN
  Administered 2020-03-10: 1 mg via INTRAVENOUS

## 2020-03-10 MED ORDER — ATROPINE SULFATE 1 MG/10ML IJ SOSY
PREFILLED_SYRINGE | INTRAMUSCULAR | Status: AC
  Filled 2020-03-10: qty 10

## 2020-03-10 MED ORDER — LIDOCAINE HCL (PF) 1 % IJ SOLN
INTRAMUSCULAR | Status: AC
  Filled 2020-03-10: qty 30

## 2020-03-10 MED ORDER — HYDRALAZINE HCL 20 MG/ML IJ SOLN: 10.0000 mg | INTRAMUSCULAR | Status: DC | PRN

## 2020-03-10 MED ORDER — FENTANYL CITRATE (PF) 100 MCG/2ML IJ SOLN
INTRAMUSCULAR | Status: AC
  Filled 2020-03-10: qty 2

## 2020-03-10 MED ORDER — LABETALOL HCL 5 MG/ML IV SOLN
INTRAVENOUS | Status: DC | PRN
  Administered 2020-03-10: 20 mg via INTRAVENOUS

## 2020-03-10 MED ORDER — HEPARIN (PORCINE) IN NACL 1000-0.9 UT/500ML-% IV SOLN
INTRAVENOUS | Status: DC | PRN
  Administered 2020-03-10: 500 mL

## 2020-03-10 MED ORDER — SODIUM CHLORIDE 0.9 % IV SOLN: 250.0000 mL | INTRAVENOUS | Status: DC | PRN

## 2020-03-10 MED ORDER — HEPARIN (PORCINE) IN NACL 1000-0.9 UT/500ML-% IV SOLN
INTRAVENOUS | Status: AC
  Filled 2020-03-10: qty 1000

## 2020-03-10 MED ORDER — ASPIRIN 81 MG PO CHEW: 81.0000 mg | CHEWABLE_TABLET | ORAL | Status: DC

## 2020-03-10 MED ORDER — SODIUM CHLORIDE 0.9 % WEIGHT BASED INFUSION: 1.0000 mL/kg/h | INTRAVENOUS | Status: DC

## 2020-03-10 MED ORDER — SODIUM CHLORIDE 0.9% FLUSH: 3.0000 mL | Freq: Two times a day (BID) | INTRAVENOUS | Status: DC

## 2020-03-10 MED ORDER — IOHEXOL 300 MG/ML  SOLN
INTRAMUSCULAR | Status: DC | PRN
  Administered 2020-03-10: 150 mL

## 2020-03-10 MED ORDER — LIDOCAINE HCL (PF) 1 % IJ SOLN
INTRAMUSCULAR | Status: DC | PRN
  Administered 2020-03-10: 15 mL

## 2020-03-10 MED ORDER — ONDANSETRON HCL 4 MG/2ML IJ SOLN: 4.0000 mg | Freq: Four times a day (QID) | INTRAMUSCULAR | Status: DC | PRN

## 2020-03-10 MED ORDER — ACETAMINOPHEN 325 MG PO TABS: 650.0000 mg | ORAL_TABLET | ORAL | Status: DC | PRN

## 2020-03-10 MED ORDER — SODIUM CHLORIDE 0.9% FLUSH: 3.0000 mL | INTRAVENOUS | Status: DC | PRN

## 2020-03-10 MED ORDER — LABETALOL HCL 5 MG/ML IV SOLN: 10.0000 mg | INTRAVENOUS | Status: DC | PRN

## 2020-03-10 MED ORDER — LABETALOL HCL 5 MG/ML IV SOLN
INTRAVENOUS | Status: AC
  Filled 2020-03-10: qty 4

## 2020-03-10 MED ORDER — SODIUM CHLORIDE 0.9 % IV SOLN
INTRAVENOUS | Status: AC | PRN
  Administered 2020-03-10: 250 mL/h via INTRAVENOUS

## 2020-03-10 SURGICAL SUPPLY — 15 items
CATH INFINITI 5 FR JL6.0 (CATHETERS) ×2 IMPLANT
CATH INFINITI 5FR AL1 (CATHETERS) ×2 IMPLANT
CATH INFINITI 5FR ANG PIGTAIL (CATHETERS) ×2 IMPLANT
CATH INFINITI 5FR JL4 (CATHETERS) ×2 IMPLANT
CATH INFINITI 5FR JL5 (CATHETERS) ×2 IMPLANT
CATH INFINITI JR4 5F (CATHETERS) ×2 IMPLANT
CATH SWAN GANZ 7F STRAIGHT (CATHETERS) ×2 IMPLANT
DEVICE CLOSURE MYNXGRIP 6/7F (Vascular Products) ×2 IMPLANT
KIT MANI 3VAL PERCEP (MISCELLANEOUS) ×2 IMPLANT
NEEDLE PERC 18GX7CM (NEEDLE) ×2 IMPLANT
PACK CARDIAC CATH (CUSTOM PROCEDURE TRAY) ×2 IMPLANT
SHEATH AVANTI 6FR X 11CM (SHEATH) ×2 IMPLANT
SHEATH AVANTI 7FRX11 (SHEATH) ×2 IMPLANT
WIRE EMERALD ST .035X150CM (WIRE) ×2 IMPLANT
WIRE GUIDERIGHT .035X150 (WIRE) ×2 IMPLANT

## 2020-03-11 ENCOUNTER — Encounter: Payer: Self-pay | Admitting: Internal Medicine

## 2020-03-11 ENCOUNTER — Ambulatory Visit: Payer: PPO

## 2020-03-12 ENCOUNTER — Ambulatory Visit: Payer: PPO

## 2020-03-17 ENCOUNTER — Ambulatory Visit: Payer: PPO

## 2020-03-19 ENCOUNTER — Ambulatory Visit: Payer: PPO

## 2020-03-23 ENCOUNTER — Ambulatory Visit: Payer: PPO

## 2020-03-25 ENCOUNTER — Ambulatory Visit: Payer: PPO

## 2020-03-31 DIAGNOSIS — Q230 Congenital stenosis of aortic valve: Secondary | ICD-10-CM | POA: Diagnosis not present

## 2020-03-31 DIAGNOSIS — Q231 Congenital insufficiency of aortic valve: Secondary | ICD-10-CM | POA: Diagnosis not present

## 2020-03-31 DIAGNOSIS — I712 Thoracic aortic aneurysm, without rupture: Secondary | ICD-10-CM | POA: Diagnosis not present

## 2020-03-31 DIAGNOSIS — I63412 Cerebral infarction due to embolism of left middle cerebral artery: Secondary | ICD-10-CM | POA: Diagnosis not present

## 2020-04-08 DIAGNOSIS — H04222 Epiphora due to insufficient drainage, left lacrimal gland: Secondary | ICD-10-CM | POA: Diagnosis not present

## 2020-04-23 DIAGNOSIS — Z125 Encounter for screening for malignant neoplasm of prostate: Secondary | ICD-10-CM | POA: Diagnosis not present

## 2020-04-23 DIAGNOSIS — E782 Mixed hyperlipidemia: Secondary | ICD-10-CM | POA: Diagnosis not present

## 2020-04-27 DIAGNOSIS — I712 Thoracic aortic aneurysm, without rupture: Secondary | ICD-10-CM | POA: Diagnosis not present

## 2020-04-27 DIAGNOSIS — Q230 Congenital stenosis of aortic valve: Secondary | ICD-10-CM | POA: Diagnosis not present

## 2020-04-27 DIAGNOSIS — Q231 Congenital insufficiency of aortic valve: Secondary | ICD-10-CM | POA: Diagnosis not present

## 2020-04-30 DIAGNOSIS — I712 Thoracic aortic aneurysm, without rupture: Secondary | ICD-10-CM | POA: Diagnosis not present

## 2020-04-30 DIAGNOSIS — Z0000 Encounter for general adult medical examination without abnormal findings: Secondary | ICD-10-CM | POA: Diagnosis not present

## 2020-04-30 DIAGNOSIS — H5213 Myopia, bilateral: Secondary | ICD-10-CM | POA: Diagnosis not present

## 2020-04-30 DIAGNOSIS — H43813 Vitreous degeneration, bilateral: Secondary | ICD-10-CM | POA: Diagnosis not present

## 2020-04-30 DIAGNOSIS — H35363 Drusen (degenerative) of macula, bilateral: Secondary | ICD-10-CM | POA: Diagnosis not present

## 2020-04-30 DIAGNOSIS — H2513 Age-related nuclear cataract, bilateral: Secondary | ICD-10-CM | POA: Diagnosis not present

## 2020-04-30 DIAGNOSIS — I63412 Cerebral infarction due to embolism of left middle cerebral artery: Secondary | ICD-10-CM | POA: Diagnosis not present

## 2020-04-30 DIAGNOSIS — C61 Malignant neoplasm of prostate: Secondary | ICD-10-CM | POA: Diagnosis not present

## 2020-05-01 DIAGNOSIS — I63532 Cerebral infarction due to unspecified occlusion or stenosis of left posterior cerebral artery: Secondary | ICD-10-CM | POA: Diagnosis not present

## 2020-05-01 DIAGNOSIS — F0280 Dementia in other diseases classified elsewhere without behavioral disturbance: Secondary | ICD-10-CM | POA: Diagnosis not present

## 2020-05-01 DIAGNOSIS — I63412 Cerebral infarction due to embolism of left middle cerebral artery: Secondary | ICD-10-CM | POA: Diagnosis not present

## 2020-05-01 DIAGNOSIS — I712 Thoracic aortic aneurysm, without rupture: Secondary | ICD-10-CM | POA: Diagnosis not present

## 2020-05-01 DIAGNOSIS — R5382 Chronic fatigue, unspecified: Secondary | ICD-10-CM | POA: Diagnosis not present

## 2020-05-01 DIAGNOSIS — F0150 Vascular dementia without behavioral disturbance: Secondary | ICD-10-CM | POA: Diagnosis not present

## 2020-05-01 DIAGNOSIS — G309 Alzheimer's disease, unspecified: Secondary | ICD-10-CM | POA: Diagnosis not present

## 2020-05-01 DIAGNOSIS — Q230 Congenital stenosis of aortic valve: Secondary | ICD-10-CM | POA: Diagnosis not present

## 2020-05-01 DIAGNOSIS — Q231 Congenital insufficiency of aortic valve: Secondary | ICD-10-CM | POA: Diagnosis not present

## 2020-05-19 DIAGNOSIS — F0281 Dementia in other diseases classified elsewhere with behavioral disturbance: Secondary | ICD-10-CM | POA: Diagnosis not present

## 2020-05-19 DIAGNOSIS — R0689 Other abnormalities of breathing: Secondary | ICD-10-CM | POA: Diagnosis not present

## 2020-05-19 DIAGNOSIS — I4892 Unspecified atrial flutter: Secondary | ICD-10-CM | POA: Diagnosis not present

## 2020-05-19 DIAGNOSIS — Q231 Congenital insufficiency of aortic valve: Secondary | ICD-10-CM | POA: Diagnosis not present

## 2020-05-19 DIAGNOSIS — F32A Depression, unspecified: Secondary | ICD-10-CM | POA: Diagnosis not present

## 2020-05-19 DIAGNOSIS — F0390 Unspecified dementia without behavioral disturbance: Secondary | ICD-10-CM | POA: Diagnosis not present

## 2020-05-19 DIAGNOSIS — Q230 Congenital stenosis of aortic valve: Secondary | ICD-10-CM | POA: Diagnosis not present

## 2020-05-19 DIAGNOSIS — I358 Other nonrheumatic aortic valve disorders: Secondary | ICD-10-CM | POA: Diagnosis not present

## 2020-05-19 DIAGNOSIS — G300 Alzheimer's disease with early onset: Secondary | ICD-10-CM | POA: Diagnosis not present

## 2020-05-19 DIAGNOSIS — Q2543 Congenital aneurysm of aorta: Secondary | ICD-10-CM | POA: Diagnosis not present

## 2020-05-19 DIAGNOSIS — I469 Cardiac arrest, cause unspecified: Secondary | ICD-10-CM | POA: Diagnosis not present

## 2020-05-19 DIAGNOSIS — I712 Thoracic aortic aneurysm, without rupture: Secondary | ICD-10-CM | POA: Diagnosis not present

## 2020-05-19 DIAGNOSIS — E669 Obesity, unspecified: Secondary | ICD-10-CM | POA: Diagnosis not present

## 2020-05-19 DIAGNOSIS — I480 Paroxysmal atrial fibrillation: Secondary | ICD-10-CM | POA: Diagnosis not present

## 2020-05-19 DIAGNOSIS — D688 Other specified coagulation defects: Secondary | ICD-10-CM | POA: Diagnosis not present

## 2020-05-19 DIAGNOSIS — Z4682 Encounter for fitting and adjustment of non-vascular catheter: Secondary | ICD-10-CM | POA: Diagnosis not present

## 2020-05-19 DIAGNOSIS — I517 Cardiomegaly: Secondary | ICD-10-CM | POA: Diagnosis not present

## 2020-05-19 DIAGNOSIS — F05 Delirium due to known physiological condition: Secondary | ICD-10-CM | POA: Diagnosis not present

## 2020-05-19 DIAGNOSIS — I509 Heart failure, unspecified: Secondary | ICD-10-CM | POA: Diagnosis not present

## 2020-05-19 DIAGNOSIS — Z9189 Other specified personal risk factors, not elsewhere classified: Secondary | ICD-10-CM | POA: Diagnosis not present

## 2020-05-19 DIAGNOSIS — Z4659 Encounter for fitting and adjustment of other gastrointestinal appliance and device: Secondary | ICD-10-CM | POA: Diagnosis not present

## 2020-05-19 DIAGNOSIS — Z952 Presence of prosthetic heart valve: Secondary | ICD-10-CM | POA: Diagnosis not present

## 2020-05-19 DIAGNOSIS — J90 Pleural effusion, not elsewhere classified: Secondary | ICD-10-CM | POA: Diagnosis not present

## 2020-05-19 DIAGNOSIS — B370 Candidal stomatitis: Secondary | ICD-10-CM | POA: Diagnosis not present

## 2020-05-19 DIAGNOSIS — F0150 Vascular dementia without behavioral disturbance: Secondary | ICD-10-CM | POA: Diagnosis not present

## 2020-05-19 DIAGNOSIS — I719 Aortic aneurysm of unspecified site, without rupture: Secondary | ICD-10-CM | POA: Diagnosis not present

## 2020-05-19 DIAGNOSIS — J982 Interstitial emphysema: Secondary | ICD-10-CM | POA: Diagnosis not present

## 2020-05-19 DIAGNOSIS — E039 Hypothyroidism, unspecified: Secondary | ICD-10-CM | POA: Diagnosis not present

## 2020-05-19 DIAGNOSIS — F0151 Vascular dementia with behavioral disturbance: Secondary | ICD-10-CM | POA: Diagnosis not present

## 2020-05-19 DIAGNOSIS — Z8674 Personal history of sudden cardiac arrest: Secondary | ICD-10-CM | POA: Diagnosis not present

## 2020-05-19 DIAGNOSIS — E875 Hyperkalemia: Secondary | ICD-10-CM | POA: Diagnosis not present

## 2020-05-19 DIAGNOSIS — D696 Thrombocytopenia, unspecified: Secondary | ICD-10-CM | POA: Diagnosis not present

## 2020-05-19 DIAGNOSIS — Z6834 Body mass index (BMI) 34.0-34.9, adult: Secondary | ICD-10-CM | POA: Diagnosis not present

## 2020-05-19 DIAGNOSIS — F0391 Unspecified dementia with behavioral disturbance: Secondary | ICD-10-CM | POA: Diagnosis not present

## 2020-05-19 DIAGNOSIS — J690 Pneumonitis due to inhalation of food and vomit: Secondary | ICD-10-CM | POA: Diagnosis not present

## 2020-05-19 DIAGNOSIS — Z20822 Contact with and (suspected) exposure to covid-19: Secondary | ICD-10-CM | POA: Diagnosis not present

## 2020-05-19 DIAGNOSIS — Z9889 Other specified postprocedural states: Secondary | ICD-10-CM | POA: Diagnosis not present

## 2020-05-19 DIAGNOSIS — R918 Other nonspecific abnormal finding of lung field: Secondary | ICD-10-CM | POA: Diagnosis not present

## 2020-05-19 DIAGNOSIS — I350 Nonrheumatic aortic (valve) stenosis: Secondary | ICD-10-CM | POA: Diagnosis not present

## 2020-05-19 DIAGNOSIS — J80 Acute respiratory distress syndrome: Secondary | ICD-10-CM | POA: Diagnosis not present

## 2020-05-19 DIAGNOSIS — J9811 Atelectasis: Secondary | ICD-10-CM | POA: Diagnosis not present

## 2020-05-19 DIAGNOSIS — E872 Acidosis: Secondary | ICD-10-CM | POA: Diagnosis not present

## 2020-05-19 DIAGNOSIS — D62 Acute posthemorrhagic anemia: Secondary | ICD-10-CM | POA: Diagnosis not present

## 2020-05-19 DIAGNOSIS — K922 Gastrointestinal hemorrhage, unspecified: Secondary | ICD-10-CM | POA: Diagnosis not present

## 2020-05-19 DIAGNOSIS — I959 Hypotension, unspecified: Secondary | ICD-10-CM | POA: Diagnosis not present

## 2020-05-19 DIAGNOSIS — N211 Calculus in urethra: Secondary | ICD-10-CM | POA: Diagnosis not present

## 2020-05-19 DIAGNOSIS — I4891 Unspecified atrial fibrillation: Secondary | ICD-10-CM | POA: Diagnosis not present

## 2020-05-19 DIAGNOSIS — J9601 Acute respiratory failure with hypoxia: Secondary | ICD-10-CM | POA: Diagnosis not present

## 2020-05-20 DIAGNOSIS — N211 Calculus in urethra: Secondary | ICD-10-CM | POA: Diagnosis not present

## 2020-05-20 DIAGNOSIS — Z4682 Encounter for fitting and adjustment of non-vascular catheter: Secondary | ICD-10-CM | POA: Diagnosis not present

## 2020-05-20 DIAGNOSIS — I712 Thoracic aortic aneurysm, without rupture: Secondary | ICD-10-CM | POA: Diagnosis not present

## 2020-05-20 DIAGNOSIS — I358 Other nonrheumatic aortic valve disorders: Secondary | ICD-10-CM | POA: Diagnosis not present

## 2020-05-20 DIAGNOSIS — I719 Aortic aneurysm of unspecified site, without rupture: Secondary | ICD-10-CM | POA: Diagnosis not present

## 2020-05-20 DIAGNOSIS — Q231 Congenital insufficiency of aortic valve: Secondary | ICD-10-CM | POA: Diagnosis not present

## 2020-05-20 DIAGNOSIS — I509 Heart failure, unspecified: Secondary | ICD-10-CM | POA: Diagnosis not present

## 2020-05-20 DIAGNOSIS — I350 Nonrheumatic aortic (valve) stenosis: Secondary | ICD-10-CM | POA: Diagnosis not present

## 2020-05-20 DIAGNOSIS — J982 Interstitial emphysema: Secondary | ICD-10-CM | POA: Diagnosis not present

## 2020-05-20 DIAGNOSIS — Q230 Congenital stenosis of aortic valve: Secondary | ICD-10-CM | POA: Diagnosis not present

## 2020-05-20 DIAGNOSIS — F0150 Vascular dementia without behavioral disturbance: Secondary | ICD-10-CM | POA: Diagnosis not present

## 2020-05-21 DIAGNOSIS — I517 Cardiomegaly: Secondary | ICD-10-CM | POA: Diagnosis not present

## 2020-05-21 DIAGNOSIS — F05 Delirium due to known physiological condition: Secondary | ICD-10-CM | POA: Diagnosis not present

## 2020-05-21 DIAGNOSIS — Q231 Congenital insufficiency of aortic valve: Secondary | ICD-10-CM | POA: Diagnosis not present

## 2020-05-21 DIAGNOSIS — Q230 Congenital stenosis of aortic valve: Secondary | ICD-10-CM | POA: Diagnosis not present

## 2020-05-21 DIAGNOSIS — Z9889 Other specified postprocedural states: Secondary | ICD-10-CM | POA: Diagnosis not present

## 2020-05-21 DIAGNOSIS — Z9189 Other specified personal risk factors, not elsewhere classified: Secondary | ICD-10-CM | POA: Diagnosis not present

## 2020-05-21 DIAGNOSIS — F0391 Unspecified dementia with behavioral disturbance: Secondary | ICD-10-CM | POA: Diagnosis not present

## 2020-05-21 DIAGNOSIS — R918 Other nonspecific abnormal finding of lung field: Secondary | ICD-10-CM | POA: Diagnosis not present

## 2020-05-22 DIAGNOSIS — Q230 Congenital stenosis of aortic valve: Secondary | ICD-10-CM | POA: Diagnosis not present

## 2020-05-22 DIAGNOSIS — Q231 Congenital insufficiency of aortic valve: Secondary | ICD-10-CM | POA: Diagnosis not present

## 2020-05-22 DIAGNOSIS — Z4682 Encounter for fitting and adjustment of non-vascular catheter: Secondary | ICD-10-CM | POA: Diagnosis not present

## 2020-05-23 DIAGNOSIS — R918 Other nonspecific abnormal finding of lung field: Secondary | ICD-10-CM | POA: Diagnosis not present

## 2020-05-23 DIAGNOSIS — Z4682 Encounter for fitting and adjustment of non-vascular catheter: Secondary | ICD-10-CM | POA: Diagnosis not present

## 2020-05-24 DIAGNOSIS — Z4682 Encounter for fitting and adjustment of non-vascular catheter: Secondary | ICD-10-CM | POA: Diagnosis not present

## 2020-05-25 DIAGNOSIS — Z952 Presence of prosthetic heart valve: Secondary | ICD-10-CM | POA: Diagnosis not present

## 2020-05-25 DIAGNOSIS — J9811 Atelectasis: Secondary | ICD-10-CM | POA: Diagnosis not present

## 2020-05-26 DIAGNOSIS — Z952 Presence of prosthetic heart valve: Secondary | ICD-10-CM | POA: Diagnosis not present

## 2020-05-26 DIAGNOSIS — J9811 Atelectasis: Secondary | ICD-10-CM | POA: Diagnosis not present

## 2020-05-26 DIAGNOSIS — J90 Pleural effusion, not elsewhere classified: Secondary | ICD-10-CM | POA: Diagnosis not present

## 2020-05-27 DIAGNOSIS — Z952 Presence of prosthetic heart valve: Secondary | ICD-10-CM | POA: Diagnosis not present

## 2020-05-27 DIAGNOSIS — J9811 Atelectasis: Secondary | ICD-10-CM | POA: Diagnosis not present

## 2020-05-27 DIAGNOSIS — J90 Pleural effusion, not elsewhere classified: Secondary | ICD-10-CM | POA: Diagnosis not present

## 2020-05-28 DIAGNOSIS — R0689 Other abnormalities of breathing: Secondary | ICD-10-CM | POA: Diagnosis not present

## 2020-05-28 DIAGNOSIS — I480 Paroxysmal atrial fibrillation: Secondary | ICD-10-CM | POA: Diagnosis not present

## 2020-05-28 DIAGNOSIS — J80 Acute respiratory distress syndrome: Secondary | ICD-10-CM | POA: Diagnosis not present

## 2020-05-28 DIAGNOSIS — I719 Aortic aneurysm of unspecified site, without rupture: Secondary | ICD-10-CM | POA: Diagnosis not present

## 2020-05-28 DIAGNOSIS — E872 Acidosis: Secondary | ICD-10-CM | POA: Diagnosis not present

## 2020-05-28 DIAGNOSIS — Z4682 Encounter for fitting and adjustment of non-vascular catheter: Secondary | ICD-10-CM | POA: Diagnosis not present

## 2020-05-28 DIAGNOSIS — Z4659 Encounter for fitting and adjustment of other gastrointestinal appliance and device: Secondary | ICD-10-CM | POA: Diagnosis not present

## 2020-05-28 DIAGNOSIS — I4891 Unspecified atrial fibrillation: Secondary | ICD-10-CM | POA: Diagnosis not present

## 2020-05-28 DIAGNOSIS — E039 Hypothyroidism, unspecified: Secondary | ICD-10-CM | POA: Diagnosis not present

## 2020-05-28 DIAGNOSIS — E669 Obesity, unspecified: Secondary | ICD-10-CM | POA: Diagnosis not present

## 2020-05-28 DIAGNOSIS — Z6834 Body mass index (BMI) 34.0-34.9, adult: Secondary | ICD-10-CM | POA: Diagnosis not present

## 2020-05-28 DIAGNOSIS — R918 Other nonspecific abnormal finding of lung field: Secondary | ICD-10-CM | POA: Diagnosis not present

## 2020-05-28 DIAGNOSIS — J9601 Acute respiratory failure with hypoxia: Secondary | ICD-10-CM | POA: Diagnosis not present

## 2020-05-28 DIAGNOSIS — Z8674 Personal history of sudden cardiac arrest: Secondary | ICD-10-CM | POA: Diagnosis not present

## 2020-05-28 DIAGNOSIS — I959 Hypotension, unspecified: Secondary | ICD-10-CM | POA: Diagnosis not present

## 2020-05-28 DIAGNOSIS — E875 Hyperkalemia: Secondary | ICD-10-CM | POA: Diagnosis not present

## 2020-05-28 DIAGNOSIS — J690 Pneumonitis due to inhalation of food and vomit: Secondary | ICD-10-CM | POA: Diagnosis not present

## 2020-05-28 DIAGNOSIS — D62 Acute posthemorrhagic anemia: Secondary | ICD-10-CM | POA: Diagnosis not present

## 2020-05-28 DIAGNOSIS — F0390 Unspecified dementia without behavioral disturbance: Secondary | ICD-10-CM | POA: Diagnosis not present

## 2020-06-05 DEATH — deceased

## 2020-11-10 IMAGING — CT CT HEAD CODE STROKE
3 series · 15 of 47 positions shown, 18 images · non-contrast
Comparison: CT head without contrast 05/17/2017.

CLINICAL DATA: Code stroke. New onset of diplopia in slurred
speech. Right facial droop. Last known well 3 hours ago.

EXAM:
CT HEAD WITHOUT CONTRAST
TECHNIQUE: Contiguous axial images were obtained from the base of the skull
through the vertex without intravenous contrast.

[Series 3: head wo · axial · 0.43mm/px · z∈[+272,+407]mm · 9 of 33 slices shown, 12 images]
[im 3/33  brain]
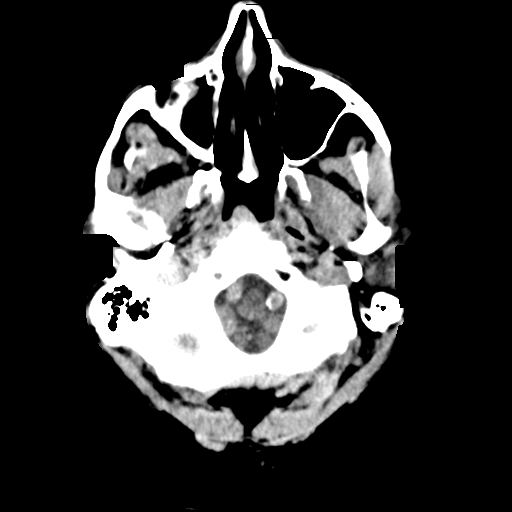
[im 3/33  bone]
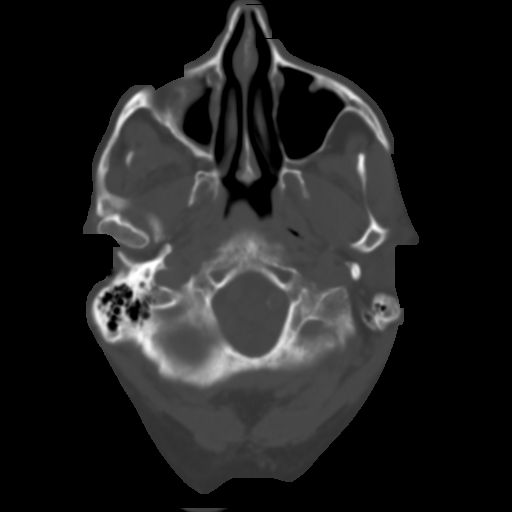
[im 6/33  brain]
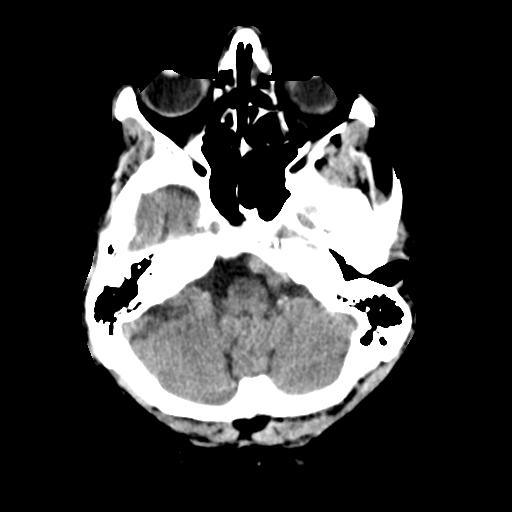
[im 9/33  brain]
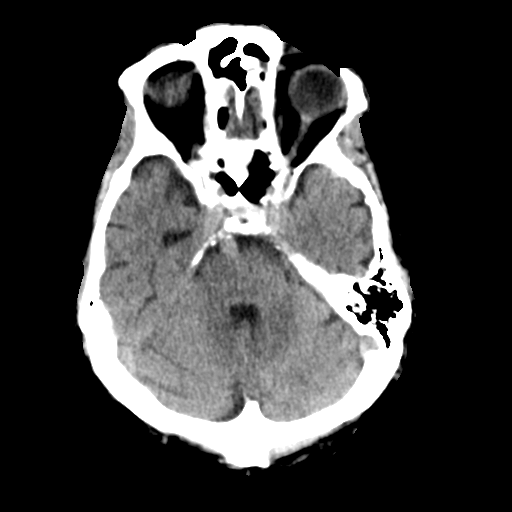
[im 13/33  brain]
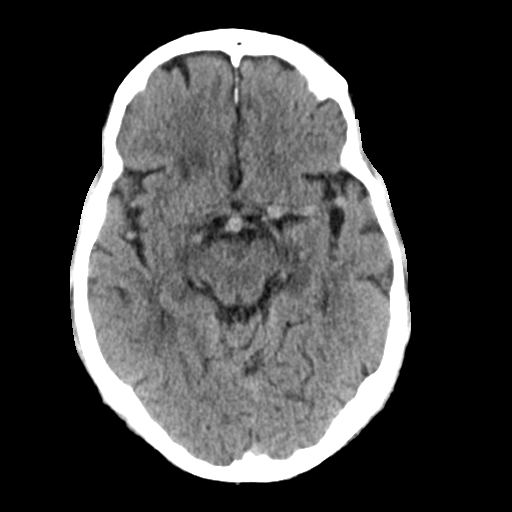
[im 17/33  brain]
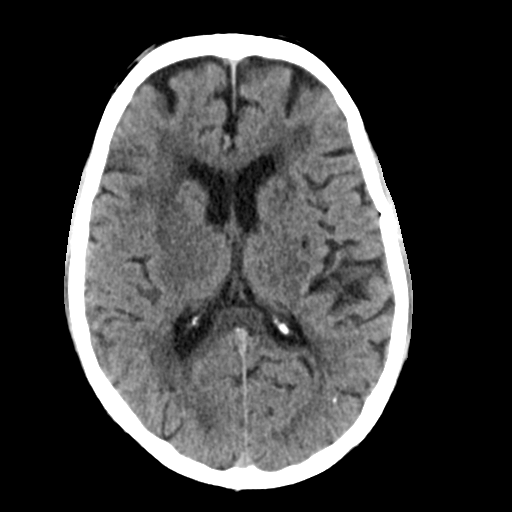
[im 17/33  bone]
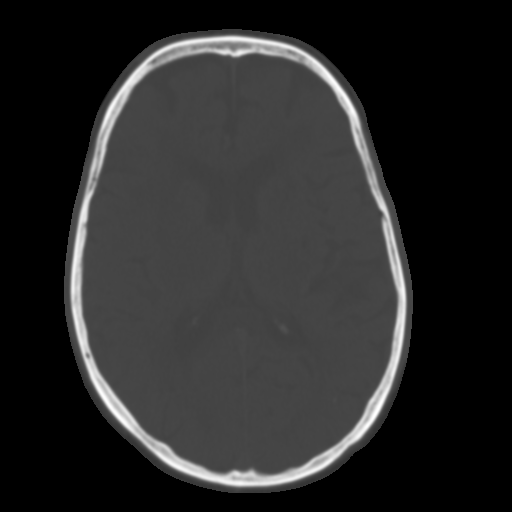
[im 20/33  brain]
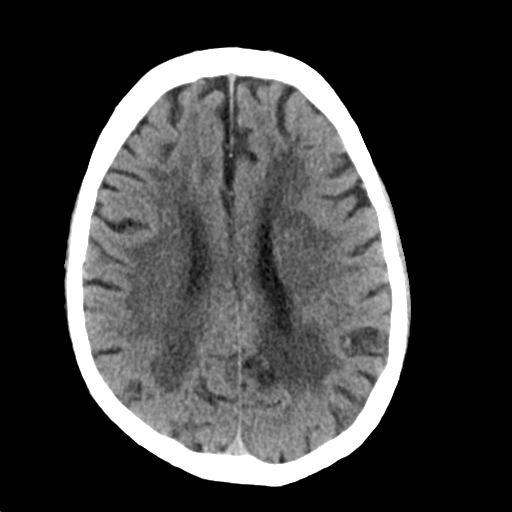
[im 24/33  brain]
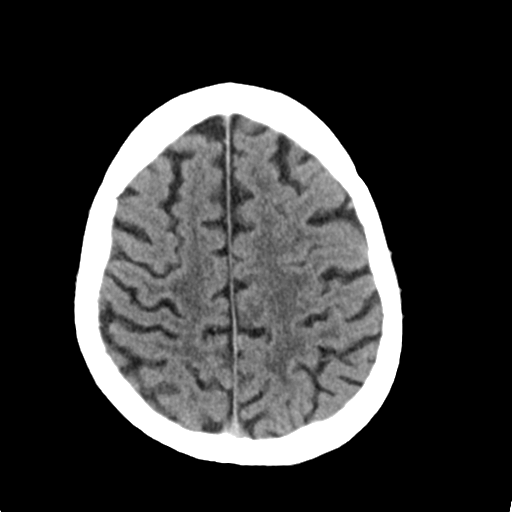
[im 27/33  brain]
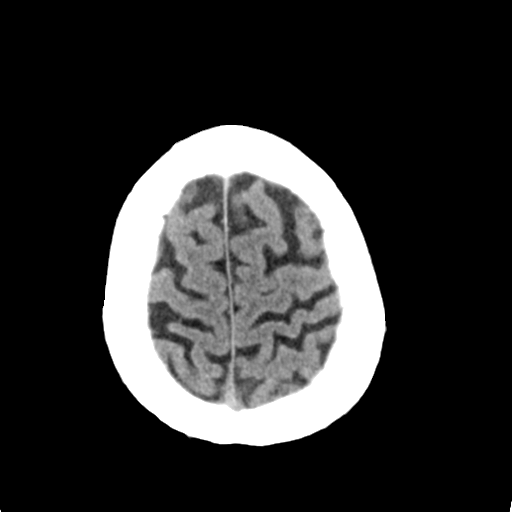
[im 30/33  brain]
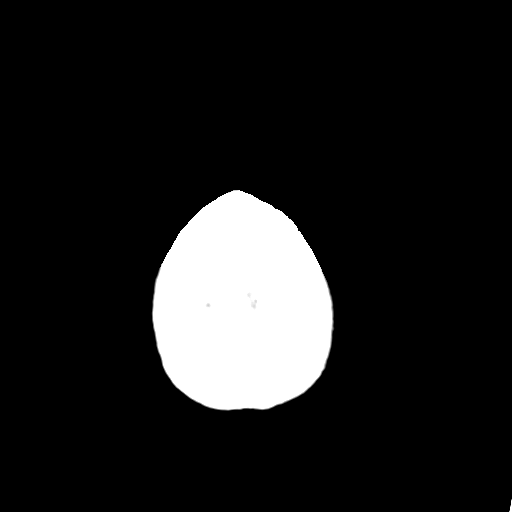
[im 30/33  bone]
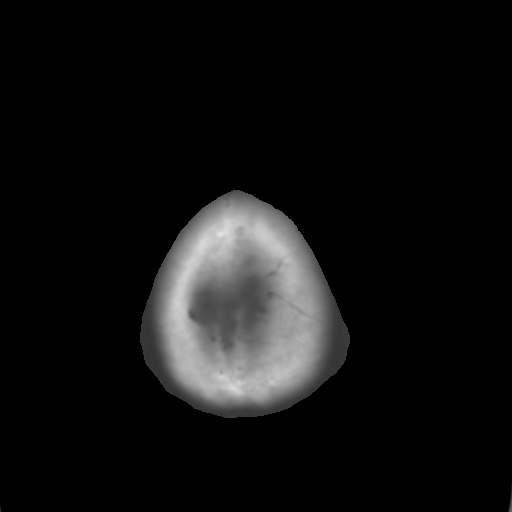

[Series 4: coronal soft tissue · coronal · 0.31mm/px · 3 of 68 slices shown]
[im 26/68  brain]
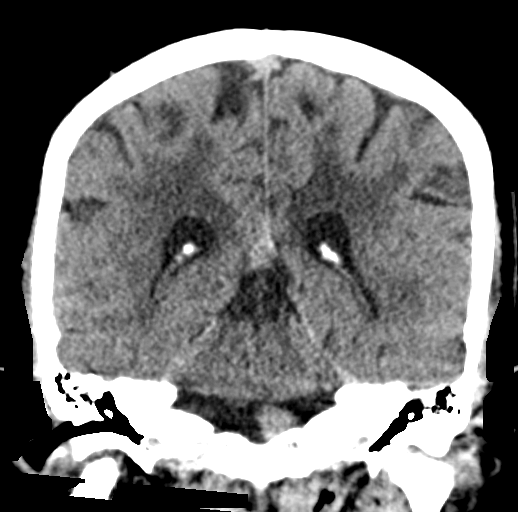
[im 31/68  brain]
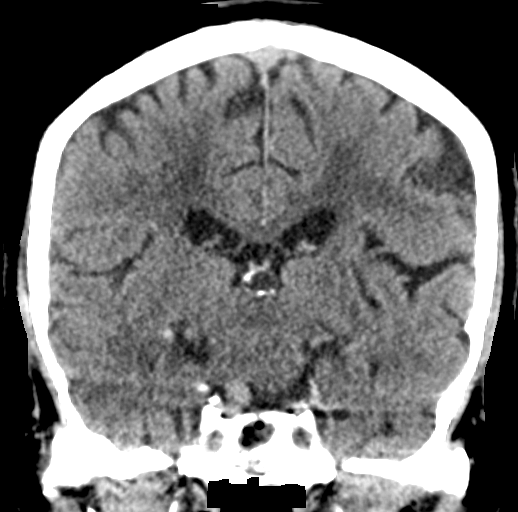
[im 37/68  brain]
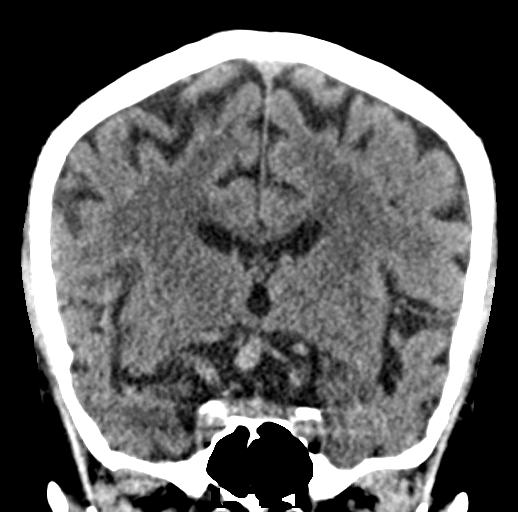

[Series 5: sagittal soft tissue · sagittal · 0.31mm/px · 3 of 55 slices shown]
[im 19/55  brain]
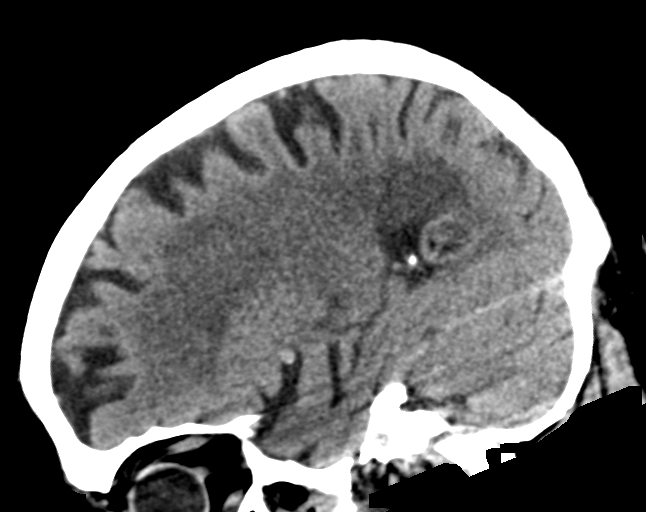
[im 28/55  brain]
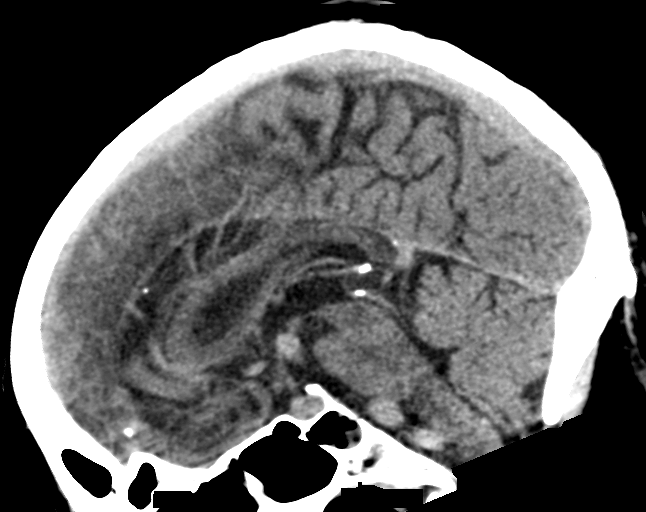
[im 37/55  brain]
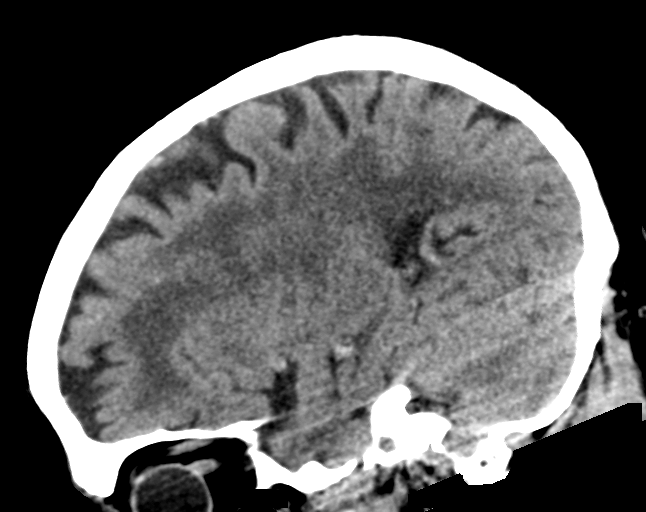

[15 of 47 positions shown; findings below may reference images not displayed]

FINDINGS: Brain: Moderate diffuse white matter changes are stable. No acute
cortical infarct is present. Basal ganglia are unchanged. No acute
hemorrhage or mass lesion is present. No significant extraaxial
fluid collection is present. The brainstem and cerebellum are within
normal limits.

Vascular: Moderate ectasia is present throughout the
circle-of-Willis. Atherosclerotic calcifications are present within
the cavernous internal carotid arteries and at the dural margin of
the vertebral arteries. There is no asymmetric hyperdense vessel.

Skull: Calvarium is intact. No focal lytic or blastic lesions are
present.

Sinuses/Orbits: Chronic right maxillary sinus disease is again seen.
Is chronic wall thickening. Bilateral anterior ethmoid mucosal
thickening is present. This extends into the inferior left frontal
sinus. There is fluid in the right sphenoid sinus. Mastoid air cells
are clear. The globes and orbits are within normal limits.

ASPECTS (Alberta Stroke Program Early CT Score)

- Ganglionic level infarction (caudate, lentiform nuclei, internal
capsule, insula, M1-M3 cortex): [DATE]

- Supraganglionic infarction (M4-M6 cortex): [DATE]

Total score (0-10 with 10 being normal): [DATE]
IMPRESSION: 1. No acute intracranial abnormality or significant interval change.
2. Stable moderate diffuse white matter disease. This likely
reflects the sequela of chronic microvascular ischemia.
3. Intracranial atherosclerosis moderate ectasia throughout the
circle-of-Willis.
4. ASPECTS is [DATE]

These results were called by telephone at the time of interpretation
on 11/11/2018 at [DATE] to BREVER GOLPE , who verbally
acknowledged these results.

## 2021-12-31 IMAGING — CT CT ANGIO CHEST
3 series · 17 of 31 positions shown · IV contrast (APPLIED)
Comparison: 11/14/2018

CLINICAL DATA: Aneurysmal dilatation of the aortic root and
proximal ascending thoracic aorta.

EXAM:
CT ANGIOGRAPHY CHEST WITH CONTRAST
TECHNIQUE: Multidetector CT imaging of the chest was performed using the
standard protocol during bolus administration of intravenous
contrast. Multiplanar CT image reconstructions and MIPs were
obtained to evaluate the vascular anatomy.
CONTRAST:  100mL OMNIPAQUE IOHEXOL 350 MG/ML SOLN

[Series 4: axial pre · axial · non-contrast · 0.79mm/px · z∈[-706,-516]mm · 5 of 64 slices shown]
[im 13/64  lung]
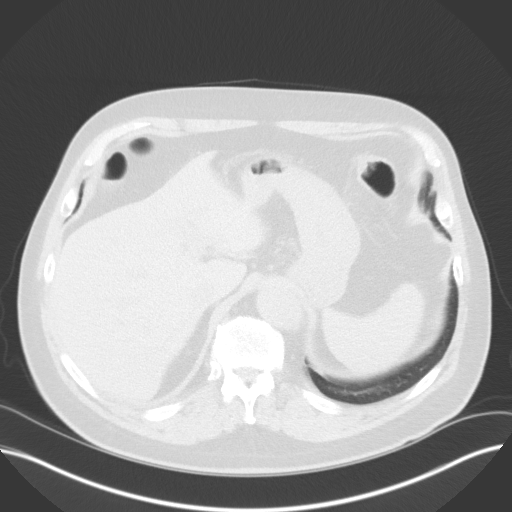
[im 26/64  lung]
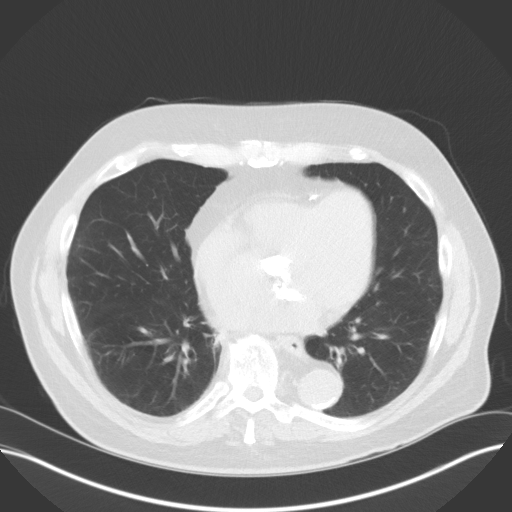
[im 31/64  lung]
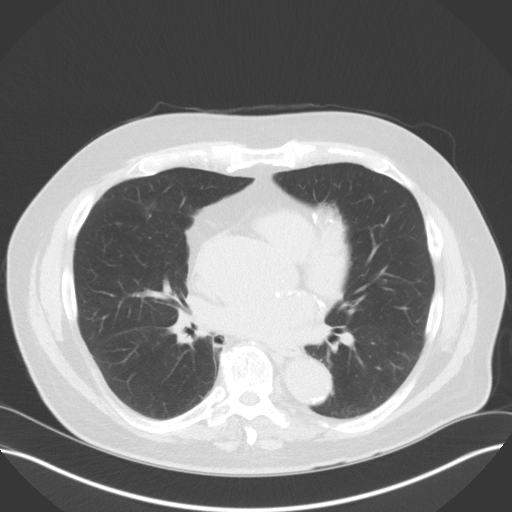
[im 38/64  lung]
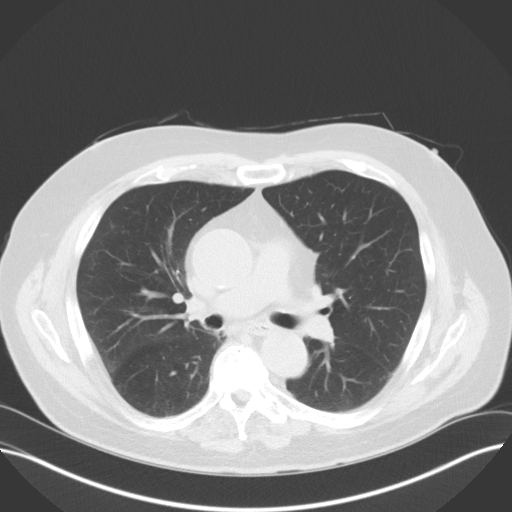
[im 51/64  lung]
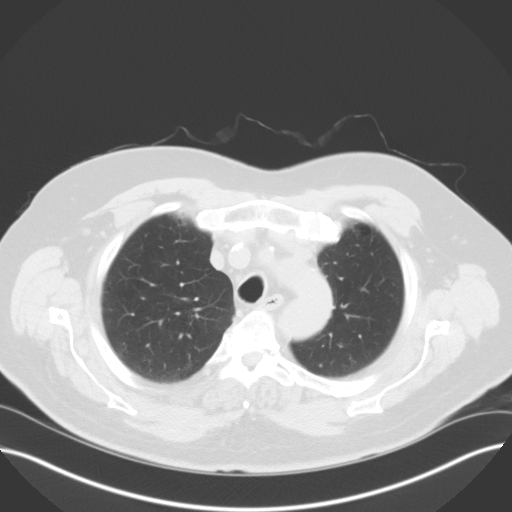

[Series 5: axial arterial · axial · arterial · 0.80mm/px · z∈[-732,-498]mm · 8 of 104 slices shown]
[im 13/104  lung]
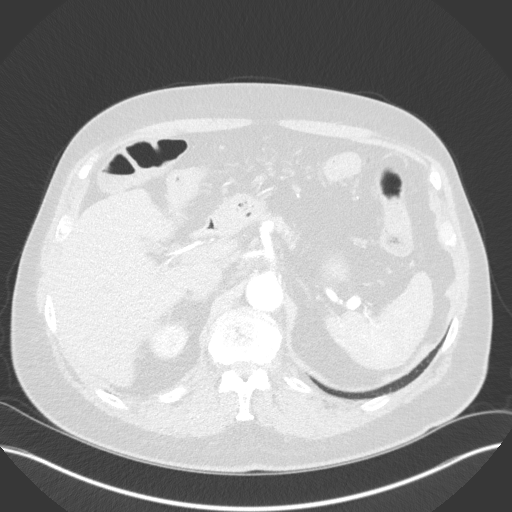
[im 26/104  mediastinal]
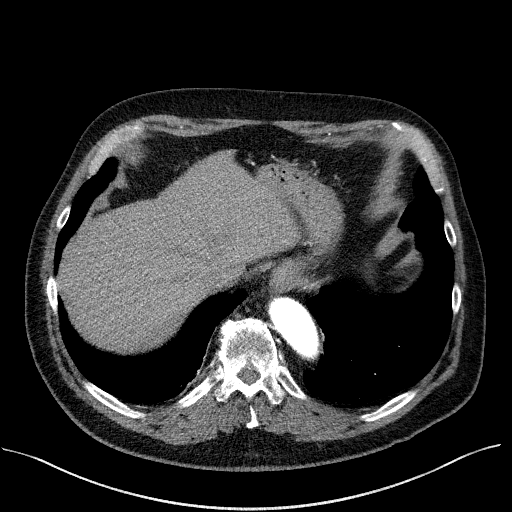
[im 39/104  lung]
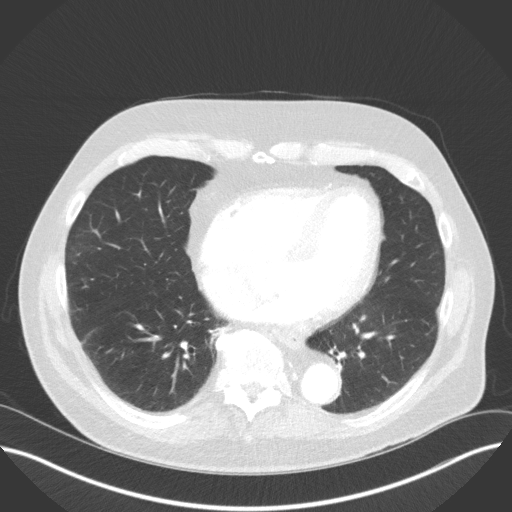
[im 51/104  mediastinal]
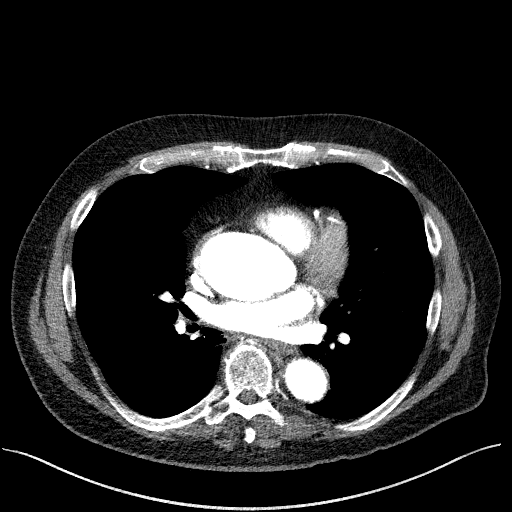
[im 52/104  lung]
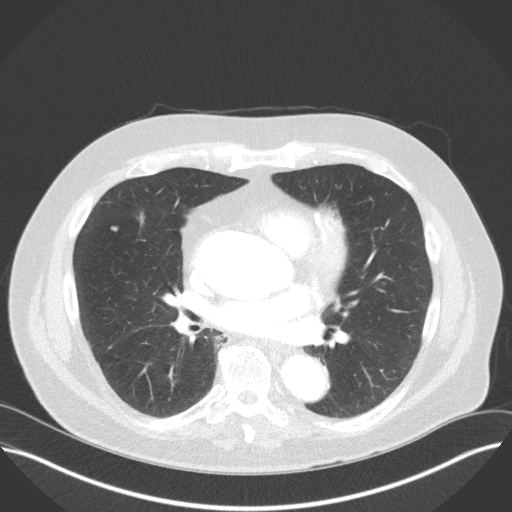
[im 65/104  mediastinal]
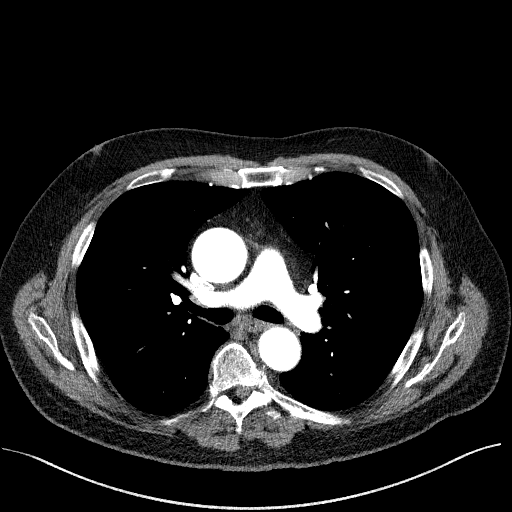
[im 78/104  lung]
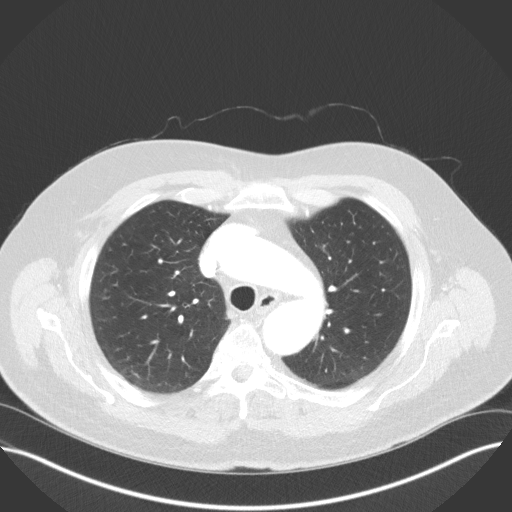
[im 91/104  mediastinal]
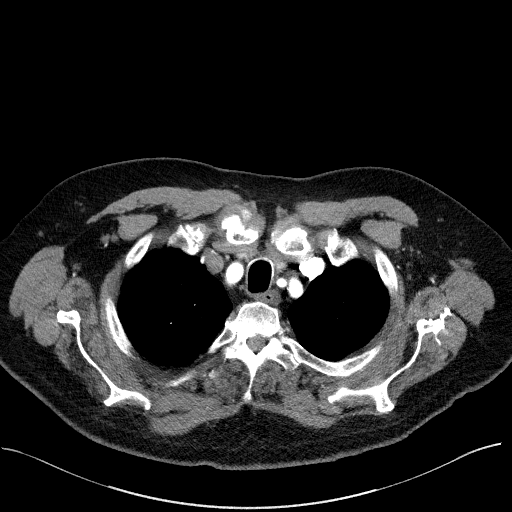

[Series 6: lung · axial · 0.80mm/px · z∈[-745,-619]mm · 4 of 156 slices shown]
[im 13/156  mediastinal]
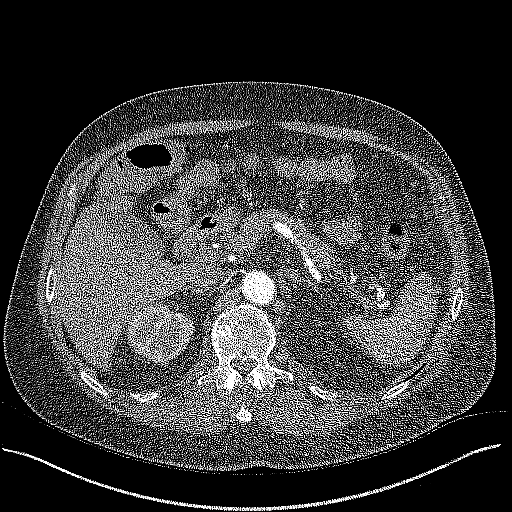
[im 39/156  mediastinal]
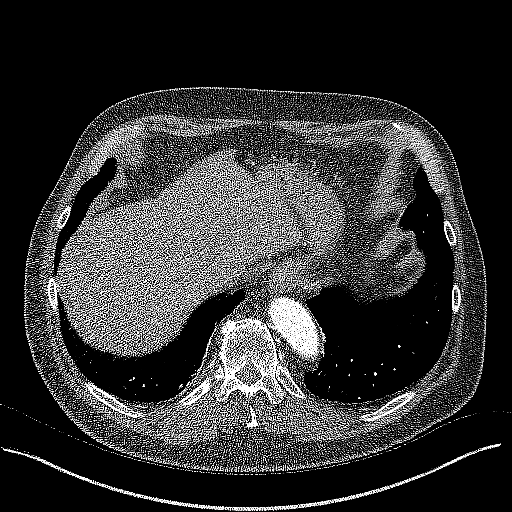
[im 52/156  mediastinal]
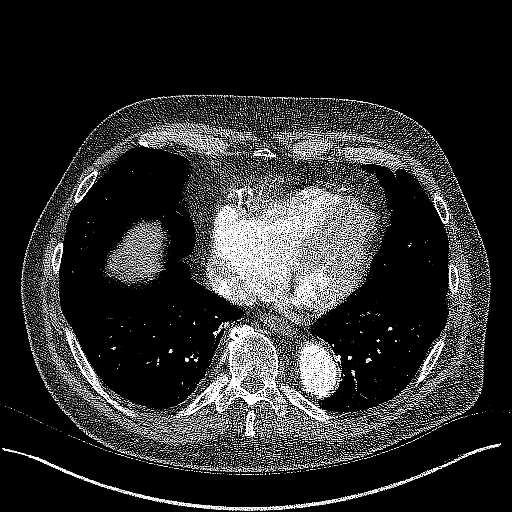
[im 76/156  mediastinal]
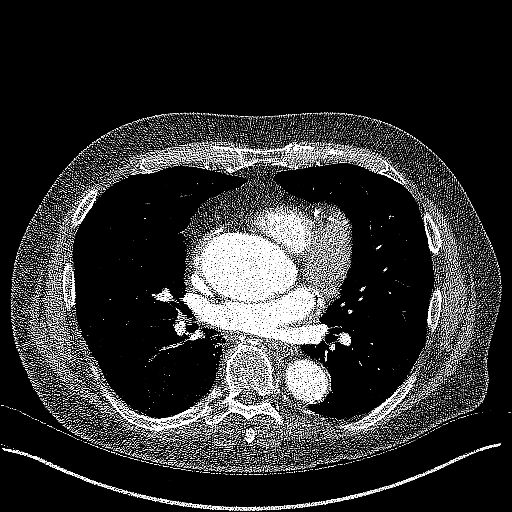

[17 of 31 positions shown; findings below may reference images not displayed]

FINDINGS: Cardiovascular: The aortic valve is again noted to be heavily
calcified and thickened in appearance. The aortic root is dilated
measuring approximately 5 cm in diameter at the level of the sinuses
of Valsalva. The proximal ascending thoracic aorta again demonstrate
significant dilatation and measures approximately 5.2-5.3 cm in
greatest diameter. When reviewing the prior study the same segment
measured approximately 5.0-5.1 cm in greatest diameter and there is
felt to be likely mild increase in caliber. The aortic arch is also
dilated with the proximal arch measuring 3.5 cm and the distal arch
3.6 cm. The proximal to mid descending thoracic aorta is also mildly
dilated measuring approximately 3.1-3.2 cm. The distal descending
thoracic aorta tapers to a normal diameter of approximately 2.7-2.8
cm.

No evidence of aortic dissection. Stable mild plaque at the level of
the aortic arch and descending thoracic aorta. Visualized proximal
great vessels demonstrate mild plaque and normal patency. Stable
heart size. No pericardial fluid. Calcified coronary artery plaque
again visualized. Central pulmonary arteries are normal in caliber.

Mediastinum/Nodes: No enlarged mediastinal, hilar, or axillary lymph
nodes. Thyroid gland, trachea, and esophagus demonstrate no
significant findings.

Lungs/Pleura: Stable tiny subpleural areas of nodularity associated
with chronic appearing scarring and tree-in-bud nodularity within
the periphery of the right upper lobe. There is no evidence of
pulmonary edema, consolidation, pneumothorax or pleural fluid.

Upper Abdomen: No acute abnormality.

Musculoskeletal: Stable degenerative disc disease of the thoracic
spine.

Review of the MIP images confirms the above findings.
IMPRESSION: 1. Stable dilatation of the aortic root measuring approximately 5 cm
in diameter at the level of the sinuses of Valsalva.
2. Aneurysmal disease of the proximal ascending thoracic aorta is
felt to have increased slightly from 5.0-5.1 cm to 5.2-5.3 cm. No
evidence of aortic dissection. Ascending thoracic aortic aneurysm.
Recommend semi-annual imaging followup by CTA or MRA and referral to
cardiothoracic surgery if not already obtained. This recommendation
follows 8898 ACCF/AHA/AATS/ACR/ASA/SCA/CEEJAY/ROSARIO/HANDLEY/DON LOLITO Guidelines
for the Diagnosis and Management of Patients With Thoracic Aortic
Disease. Circulation. 8898; 121: [id number]. Aortic aneurysm NOS
([id number].O)
3. Stable aneurysmal disease of the aortic arch and mild dilatation
of the proximal to mid descending thoracic aorta.
4. Stable tiny subpleural areas of nodularity associated with
chronic appearing scarring and tree-in-bud nodularity within the
periphery of the right upper lobe.
5. Coronary artery disease.
6. Aortic atherosclerosis.

Aortic aneurysm NOS ([id number].O).
